# Patient Record
Sex: Female | Born: 1945 | Race: Black or African American | Hispanic: No | Smoking: Former smoker
Health system: Southern US, Community
[De-identification: ages and names within clinical notes are randomized; demographics above are authoritative.]

## PROBLEM LIST (undated history)

## (undated) DIAGNOSIS — F1011 Alcohol abuse, in remission: Secondary | ICD-10-CM

## (undated) DIAGNOSIS — K7460 Unspecified cirrhosis of liver: Secondary | ICD-10-CM

## (undated) DIAGNOSIS — M1990 Unspecified osteoarthritis, unspecified site: Secondary | ICD-10-CM

## (undated) DIAGNOSIS — I10 Essential (primary) hypertension: Secondary | ICD-10-CM

## (undated) DIAGNOSIS — D649 Anemia, unspecified: Secondary | ICD-10-CM

## (undated) DIAGNOSIS — E039 Hypothyroidism, unspecified: Secondary | ICD-10-CM

## (undated) DIAGNOSIS — E785 Hyperlipidemia, unspecified: Secondary | ICD-10-CM

## (undated) DIAGNOSIS — I5030 Unspecified diastolic (congestive) heart failure: Secondary | ICD-10-CM

## (undated) DIAGNOSIS — M109 Gout, unspecified: Secondary | ICD-10-CM

## (undated) DIAGNOSIS — T884XXA Failed or difficult intubation, initial encounter: Secondary | ICD-10-CM

## (undated) DIAGNOSIS — K219 Gastro-esophageal reflux disease without esophagitis: Secondary | ICD-10-CM

## (undated) HISTORY — PX: CATARACT EXTRACTION: SUR2

## (undated) HISTORY — PX: KNEE CARTILAGE SURGERY: SHX688

## (undated) HISTORY — PX: ABDOMINAL HYSTERECTOMY: SHX81

---

## 2016-10-14 ENCOUNTER — Inpatient Hospital Stay: Payer: Medicare Other

## 2016-10-14 ENCOUNTER — Emergency Department: Payer: Medicare Other

## 2016-10-14 ENCOUNTER — Inpatient Hospital Stay
Admission: EM | Admit: 2016-10-14 | Discharge: 2016-10-18 | DRG: 481 | Disposition: A | Discharge status: Skilled Nursing Facility | Payer: Medicare Other | Attending: Internal Medicine | Admitting: Internal Medicine

## 2016-10-14 ENCOUNTER — Encounter: Payer: Self-pay | Admitting: Emergency Medicine

## 2016-10-14 DIAGNOSIS — I5032 Chronic diastolic (congestive) heart failure: Secondary | ICD-10-CM | POA: Diagnosis present

## 2016-10-14 DIAGNOSIS — M1990 Unspecified osteoarthritis, unspecified site: Secondary | ICD-10-CM | POA: Diagnosis present

## 2016-10-14 DIAGNOSIS — W1830XA Fall on same level, unspecified, initial encounter: Secondary | ICD-10-CM | POA: Diagnosis present

## 2016-10-14 DIAGNOSIS — M79652 Pain in left thigh: Secondary | ICD-10-CM | POA: Diagnosis present

## 2016-10-14 DIAGNOSIS — K7460 Unspecified cirrhosis of liver: Secondary | ICD-10-CM | POA: Diagnosis present

## 2016-10-14 DIAGNOSIS — Z79899 Other long term (current) drug therapy: Secondary | ICD-10-CM

## 2016-10-14 DIAGNOSIS — D631 Anemia in chronic kidney disease: Secondary | ICD-10-CM | POA: Diagnosis present

## 2016-10-14 DIAGNOSIS — M109 Gout, unspecified: Secondary | ICD-10-CM | POA: Diagnosis present

## 2016-10-14 DIAGNOSIS — E785 Hyperlipidemia, unspecified: Secondary | ICD-10-CM | POA: Diagnosis present

## 2016-10-14 DIAGNOSIS — Z87891 Personal history of nicotine dependence: Secondary | ICD-10-CM | POA: Diagnosis not present

## 2016-10-14 DIAGNOSIS — D62 Acute posthemorrhagic anemia: Secondary | ICD-10-CM | POA: Diagnosis not present

## 2016-10-14 DIAGNOSIS — E039 Hypothyroidism, unspecified: Secondary | ICD-10-CM | POA: Diagnosis present

## 2016-10-14 DIAGNOSIS — E860 Dehydration: Secondary | ICD-10-CM | POA: Diagnosis present

## 2016-10-14 DIAGNOSIS — S72302A Unspecified fracture of shaft of left femur, initial encounter for closed fracture: Principal | ICD-10-CM | POA: Diagnosis present

## 2016-10-14 DIAGNOSIS — Z419 Encounter for procedure for purposes other than remedying health state, unspecified: Secondary | ICD-10-CM

## 2016-10-14 DIAGNOSIS — I509 Heart failure, unspecified: Secondary | ICD-10-CM

## 2016-10-14 DIAGNOSIS — N179 Acute kidney failure, unspecified: Secondary | ICD-10-CM

## 2016-10-14 DIAGNOSIS — N189 Chronic kidney disease, unspecified: Secondary | ICD-10-CM | POA: Diagnosis present

## 2016-10-14 DIAGNOSIS — K219 Gastro-esophageal reflux disease without esophagitis: Secondary | ICD-10-CM | POA: Diagnosis present

## 2016-10-14 DIAGNOSIS — S72309A Unspecified fracture of shaft of unspecified femur, initial encounter for closed fracture: Secondary | ICD-10-CM

## 2016-10-14 DIAGNOSIS — S72009A Fracture of unspecified part of neck of unspecified femur, initial encounter for closed fracture: Secondary | ICD-10-CM | POA: Diagnosis present

## 2016-10-14 DIAGNOSIS — I130 Hypertensive heart and chronic kidney disease with heart failure and stage 1 through stage 4 chronic kidney disease, or unspecified chronic kidney disease: Secondary | ICD-10-CM | POA: Diagnosis present

## 2016-10-14 DIAGNOSIS — S7292XA Unspecified fracture of left femur, initial encounter for closed fracture: Secondary | ICD-10-CM

## 2016-10-14 HISTORY — DX: Gout, unspecified: M10.9

## 2016-10-14 HISTORY — DX: Hyperlipidemia, unspecified: E78.5

## 2016-10-14 HISTORY — DX: Alcohol abuse, in remission: F10.11

## 2016-10-14 HISTORY — DX: Hypothyroidism, unspecified: E03.9

## 2016-10-14 HISTORY — DX: Unspecified cirrhosis of liver: K74.60

## 2016-10-14 HISTORY — DX: Essential (primary) hypertension: I10

## 2016-10-14 HISTORY — DX: Unspecified diastolic (congestive) heart failure: I50.30

## 2016-10-14 HISTORY — DX: Unspecified osteoarthritis, unspecified site: M19.90

## 2016-10-14 HISTORY — DX: Failed or difficult intubation, initial encounter: T88.4XXA

## 2016-10-14 HISTORY — DX: Gastro-esophageal reflux disease without esophagitis: K21.9

## 2016-10-14 HISTORY — DX: Anemia, unspecified: D64.9

## 2016-10-14 LAB — BASIC METABOLIC PANEL
ANION GAP: 7 (ref 5–15)
BUN: 14 mg/dL (ref 6–20)
CHLORIDE: 99 mmol/L — AB (ref 101–111)
CO2: 29 mmol/L (ref 22–32)
Calcium: 9.5 mg/dL (ref 8.9–10.3)
Creatinine, Ser: 2.1 mg/dL — ABNORMAL HIGH (ref 0.44–1.00)
GFR, EST AFRICAN AMERICAN: 26 mL/min — AB (ref >60)
GFR, EST NON AFRICAN AMERICAN: 22 mL/min — AB (ref >60)
Glucose, Bld: 131 mg/dL — ABNORMAL HIGH (ref 65–99)
POTASSIUM: 4 mmol/L (ref 3.5–5.1)
SODIUM: 135 mmol/L (ref 135–145)

## 2016-10-14 LAB — CBC
HCT: 39.7 % (ref 35.0–47.0)
HEMOGLOBIN: 13 g/dL (ref 12.0–16.0)
MCH: 28.9 pg (ref 26.0–34.0)
MCHC: 32.9 g/dL (ref 32.0–36.0)
MCV: 88 fL (ref 80.0–100.0)
PLATELETS: 221 10*3/uL (ref 150–440)
RBC: 4.51 MIL/uL (ref 3.80–5.20)
RDW: 16.1 % — ABNORMAL HIGH (ref 11.5–14.5)
WBC: 8.5 10*3/uL (ref 3.6–11.0)

## 2016-10-14 LAB — URINALYSIS, COMPLETE (UACMP) WITH MICROSCOPIC
BILIRUBIN URINE: NEGATIVE
Bacteria, UA: NONE SEEN
Glucose, UA: NEGATIVE mg/dL
Ketones, ur: NEGATIVE mg/dL
LEUKOCYTES UA: NEGATIVE
Nitrite: NEGATIVE
PH: 6 (ref 5.0–8.0)
Protein, ur: 30 mg/dL — AB
Specific Gravity, Urine: 1.01 (ref 1.005–1.030)

## 2016-10-14 LAB — APTT: APTT: 32 s (ref 24–36)

## 2016-10-14 LAB — TYPE AND SCREEN
ABO/RH(D): B POS
Antibody Screen: NEGATIVE

## 2016-10-14 LAB — PROTIME-INR
INR: 1.13
PROTHROMBIN TIME: 14.6 s (ref 11.4–15.2)

## 2016-10-14 LAB — SURGICAL PCR SCREEN
MRSA, PCR: NEGATIVE
Staphylococcus aureus: NEGATIVE

## 2016-10-14 LAB — CK: CK TOTAL: 95 U/L (ref 38–234)

## 2016-10-14 MED ORDER — ACETAMINOPHEN 650 MG RE SUPP: 650.0000 mg | Freq: Four times a day (QID) | RECTAL | Status: DC | PRN

## 2016-10-14 MED ORDER — MORPHINE SULFATE (PF) 4 MG/ML IV SOLN
4.0000 mg | Freq: Once | INTRAVENOUS | Status: DC
  Filled 2016-10-14: qty 1

## 2016-10-14 MED ORDER — VITAMIN B-1 100 MG PO TABS
100.0000 mg | ORAL_TABLET | Freq: Every day | ORAL | Status: DC
  Administered 2016-10-14 – 2016-10-18 (×4): 100 mg via ORAL
  Filled 2016-10-14 (×4): qty 1

## 2016-10-14 MED ORDER — FOLIC ACID 1 MG PO TABS
1.0000 mg | ORAL_TABLET | Freq: Every day | ORAL | Status: DC
  Administered 2016-10-14 – 2016-10-18 (×4): 1 mg via ORAL
  Filled 2016-10-14 (×4): qty 1

## 2016-10-14 MED ORDER — HYDROXYZINE PAMOATE 25 MG PO CAPS
25.0000 mg | ORAL_CAPSULE | Freq: Two times a day (BID) | ORAL | Status: DC
  Filled 2016-10-14 (×2): qty 1

## 2016-10-14 MED ORDER — LOSARTAN POTASSIUM 25 MG PO TABS
12.5000 mg | ORAL_TABLET | Freq: Every day | ORAL | Status: DC
  Administered 2016-10-14: 12.5 mg via ORAL
  Filled 2016-10-14: qty 0.5

## 2016-10-14 MED ORDER — OXYCODONE HCL 5 MG PO TABS
5.0000 mg | ORAL_TABLET | ORAL | Status: DC | PRN
  Administered 2016-10-14 – 2016-10-15 (×4): 5 mg via ORAL
  Filled 2016-10-14 (×4): qty 1

## 2016-10-14 MED ORDER — TRAZODONE HCL 100 MG PO TABS
100.0000 mg | ORAL_TABLET | Freq: Every day | ORAL | Status: DC
  Administered 2016-10-14 – 2016-10-17 (×4): 100 mg via ORAL
  Filled 2016-10-14 (×4): qty 1

## 2016-10-14 MED ORDER — ONDANSETRON HCL 4 MG/2ML IJ SOLN: 4.0000 mg | Freq: Four times a day (QID) | INTRAMUSCULAR | Status: DC | PRN

## 2016-10-14 MED ORDER — MORPHINE SULFATE (PF) 2 MG/ML IV SOLN
2.0000 mg | Freq: Once | INTRAVENOUS | Status: AC
  Administered 2016-10-14: 2 mg via INTRAVENOUS
  Filled 2016-10-14: qty 1

## 2016-10-14 MED ORDER — LEVOTHYROXINE SODIUM 100 MCG PO TABS
100.0000 ug | ORAL_TABLET | Freq: Every day | ORAL | Status: DC
  Administered 2016-10-16 – 2016-10-18 (×3): 100 ug via ORAL
  Filled 2016-10-14 (×3): qty 1

## 2016-10-14 MED ORDER — GABAPENTIN 300 MG PO CAPS
900.0000 mg | ORAL_CAPSULE | Freq: Three times a day (TID) | ORAL | Status: DC
  Administered 2016-10-14 – 2016-10-18 (×10): 900 mg via ORAL
  Filled 2016-10-14 (×10): qty 3

## 2016-10-14 MED ORDER — ROSUVASTATIN CALCIUM 20 MG PO TABS
40.0000 mg | ORAL_TABLET | Freq: Every day | ORAL | Status: DC
  Administered 2016-10-14 – 2016-10-18 (×4): 40 mg via ORAL
  Filled 2016-10-14 (×4): qty 2
  Filled 2016-10-14: qty 4

## 2016-10-14 MED ORDER — ONDANSETRON HCL 4 MG PO TABS: 4.0000 mg | ORAL_TABLET | Freq: Four times a day (QID) | ORAL | Status: DC | PRN

## 2016-10-14 MED ORDER — CARVEDILOL 6.25 MG PO TABS
6.2500 mg | ORAL_TABLET | Freq: Two times a day (BID) | ORAL | Status: DC
  Administered 2016-10-14 – 2016-10-18 (×7): 6.25 mg via ORAL
  Filled 2016-10-14 (×7): qty 1

## 2016-10-14 MED ORDER — HYDROXYZINE HCL 25 MG PO TABS
25.0000 mg | ORAL_TABLET | Freq: Two times a day (BID) | ORAL | Status: DC
  Administered 2016-10-14 – 2016-10-18 (×8): 25 mg via ORAL
  Filled 2016-10-14 (×9): qty 1

## 2016-10-14 MED ORDER — PANTOPRAZOLE SODIUM 40 MG PO TBEC
40.0000 mg | DELAYED_RELEASE_TABLET | Freq: Every day | ORAL | Status: DC
  Administered 2016-10-14 – 2016-10-18 (×4): 40 mg via ORAL
  Filled 2016-10-14 (×4): qty 1

## 2016-10-14 MED ORDER — HYDRALAZINE HCL 20 MG/ML IJ SOLN
10.0000 mg | Freq: Four times a day (QID) | INTRAMUSCULAR | Status: DC | PRN
  Administered 2016-10-14: 10 mg via INTRAVENOUS
  Filled 2016-10-14: qty 1

## 2016-10-14 MED ORDER — CEFAZOLIN SODIUM-DEXTROSE 2-4 GM/100ML-% IV SOLN
2.0000 g | INTRAVENOUS | Status: AC
  Administered 2016-10-15: 2 g via INTRAVENOUS
  Filled 2016-10-14: qty 100

## 2016-10-14 MED ORDER — ALLOPURINOL 100 MG PO TABS
100.0000 mg | ORAL_TABLET | Freq: Every day | ORAL | Status: DC
  Administered 2016-10-14 – 2016-10-18 (×4): 100 mg via ORAL
  Filled 2016-10-14 (×4): qty 1

## 2016-10-14 MED ORDER — SODIUM CHLORIDE 0.9 % IV BOLUS (SEPSIS)
1000.0000 mL | Freq: Once | INTRAVENOUS | Status: AC
  Administered 2016-10-14: 1000 mL via INTRAVENOUS

## 2016-10-14 MED ORDER — SODIUM CHLORIDE 0.9 % IV SOLN
INTRAVENOUS | Status: DC
  Administered 2016-10-14 – 2016-10-15 (×4): via INTRAVENOUS

## 2016-10-14 MED ORDER — ACETAMINOPHEN 325 MG PO TABS
650.0000 mg | ORAL_TABLET | Freq: Four times a day (QID) | ORAL | Status: DC | PRN
  Administered 2016-10-17: 650 mg via ORAL
  Filled 2016-10-14 (×2): qty 2

## 2016-10-14 NOTE — Consult Note (Signed)
ORTHOPAEDIC CONSULTATION  PATIENT NAME: Sarah Lowery DOB: 09/25/1945  MRN: [id number]  REQUESTING PHYSICIAN: Auburn BilberryPatel, Shreyang, MD  Chief Complaint: Left thigh pain  HPI: Sarah EonRosalind Kucharski is a 71 y.o. female who complains of  left thigh pain. She reports this 7-10 month history of progressive bilateral knee pain. She apparently awoke to the bathroom last night and states that her knees buckled and she eventually fell to the floor, causing the left leg to be flexed underneath her. She was unable to stand or bear weight due to the left thigh pain. She remained on the floor throughout the night to go she was found by her family this morning. She denied any loss of consciousness. She denied any other injury. Prior to this injury she was using a walker for ambulation due to the knee pain.  Past Medical History:  Diagnosis Date  . Anemia   . Arthritis   . Diastolic CHF (HCC)   . GERD (gastroesophageal reflux disease)   . Gout   . H/O ETOH abuse   . Hyperlipemia   . Hypertension   . Hypothyroidism   . Liver cirrhosis (HCC)   . OA (osteoarthritis)    Past Surgical History:  Procedure Laterality Date  . ABDOMINAL HYSTERECTOMY    . CATARACT EXTRACTION    . CESAREAN SECTION    . KNEE CARTILAGE SURGERY Bilateral    Social History   Social History  . Marital status: Divorced    Spouse name: N/A  . Number of children: N/A  . Years of education: N/A   Social History Main Topics  . Smoking status: Former Games developermoker  . Smokeless tobacco: Never Used  . Alcohol use No     Comment: h/o  . Drug use: No  . Sexual activity: Not Asked   Other Topics Concern  . None   Social History Narrative  . None   Family History  Problem Relation Age of Onset  . Cancer Mother    Allergies  Allergen Reactions  . Hydrochlorothiazide Other (See Comments)    Causes hyponatremia if used concomitantly with furosemide  . Ace Inhibitors Other (See Comments)    cough  . Isosorbide Nitrate      headache  . Nsaids     Kidney Disease   Prior to Admission medications   Medication Sig Start Date End Date Taking? Authorizing Provider  allopurinol (ZYLOPRIM) 100 MG tablet Take 100 mg by mouth daily.   Yes [provider]  carvedilol (COREG) 6.25 MG tablet Take 6.25 mg by mouth 2 (two) times daily with a meal.   Yes [provider]  folic acid (FOLVITE) 1 MG tablet Take 1 mg by mouth daily.   Yes [provider]  furosemide (LASIX) 40 MG tablet Take 80 mg by mouth.   Yes [provider]  gabapentin (NEURONTIN) 300 MG capsule Take 900 mg by mouth 3 (three) times daily.   Yes [provider]  hydrOXYzine (VISTARIL) 25 MG capsule Take 25 mg by mouth 2 (two) times daily.   Yes [provider]  levothyroxine (SYNTHROID, LEVOTHROID) 100 MCG tablet Take 100 mcg by mouth daily before breakfast.   Yes [provider]  losartan (COZAAR) 25 MG tablet Take 12.5 mg by mouth daily.   Yes [provider]  omeprazole (PRILOSEC) 20 MG capsule Take 20 mg by mouth daily.   Yes [provider]  rosuvastatin (CRESTOR) 40 MG tablet Take 40 mg by mouth daily.   Yes [provider]  thiamine (VITAMIN B-1) 100 MG tablet Take 100 mg by mouth daily.   Yes [provider]  traZODone (DESYREL) 50 MG tablet Take 100 mg by mouth at bedtime.   Yes [provider]  oxycodone (OXY-IR) 5 MG capsule Take 5 mg by mouth every 8 (eight) hours as needed.    [provider]   Dg Chest Port 1 View  Result Date: 10/14/2016 CLINICAL DATA:  Status post fall at home today. History of CHF, hypertension, cirrhosis, former smoker. EXAM: PORTABLE CHEST 1 VIEW COMPARISON:  None in PACs FINDINGS: The patient is rotated to the right on this study. There is mild elevation of the left hemidiaphragm Wood's which limits excursion of the left lung. The aerated portions of both lungs are clear. The heart is top-normal in size. The  pulmonary vascularity is not engorged. The bony thorax exhibits no acute abnormality. IMPRESSION: No pneumonia nor pulmonary edema. Mild elevation of the left hemidiaphragm of uncertain age. Electronically Signed   By: David  [place] M.D.   On: 10/14/2016 10:55   Dg Knee Left Port  Result Date: 10/14/2016 CLINICAL DATA:  Left knee pain after fall today at home. EXAM: PORTABLE LEFT KNEE - 1-2 VIEW COMPARISON:  None. FINDINGS: Severely displaced spiral fracture of distal left femur is noted. Severe degenerative joint disease is noted medially in the left knee. No soft tissue abnormality is noted. IMPRESSION: Severely displaced spiral fracture of distal left femur shaft. Electronically Signed   By: Lupita Raider, M.D.   On: 10/14/2016 09:25   Dg Femur Portable Min 2 Views Left  Result Date: 10/14/2016 CLINICAL DATA:  Left femur pain after fall today at home. EXAM: LEFT FEMUR PORTABLE 2 VIEWS COMPARISON:  None. FINDINGS: Severely displaced spiral fracture is seen involving the distal left femoral shaft. Severe degenerative changes seen involving the left knee joint medially. Left hip is unremarkable. IMPRESSION: Severely displaced spiral fracture involving the distal left femoral shaft. Electronically Signed   By: Lupita Raider, M.D.   On: 10/14/2016 09:24    Positive ROS: All other systems have been reviewed and were otherwise negative with the exception of those mentioned in the HPI and as above.  Physical Exam: General: Well developed, well nourished female seen in no acute distress. HEENT: Atraumatic and normocephalic. Sclera are clear. Extraocular motion is intact. Oropharynx is clear with moist mucosa. Neck: Supple, nontender, good range of motion. No JVD or carotid bruits. Lungs: Clear to auscultation bilaterally. Cardiovascular: Regular rate and rhythm with normal S1 and S2. No murmurs. No gallops or rubs. Pedal pulses are palpable bilaterally. Homans test is negative bilaterally. No  significant pretibial or ankle edema. Abdomen: Soft, nontender, and nondistended. Bowel sounds are present. Skin: No lesions in the area of chief complaint Neurologic: Awake, alert, and oriented. Sensory function is grossly intact. Motor strength is felt to be 5 over 5 with the exception of the left lower extremity that was not assessed due to the injury. No clonus or tremor. Good motor coordination. Lymphatic: No axillary or cervical lymphadenopathy  MUSCULOSKELETAL: Examination of the left lower extremity demonstrates gross instability to the distal third of the thigh. Moderate soft tissue swelling is noted about the knee and thigh. The skin is intact. Pain is elicited with any attempted range of motion. No gross tenderness to palpation about the foot or ankle. No significant tenderness to palpation about the greater trochanter.  Radiographs: I reviewed AP and lateral radiographs of the left femur  and left knee. Severe degenerative changes are noted to the left knee. There is an oblique fracture at the juncture of the middle and distal third of the femur with displacement.  Assessment: Left distal femur fracture  Plan: The findings were discussed in detail with the patient. Recommendation was made for open reduction and internal fixation of the left distal femur fracture. The usual perioperative course was discussed. The risks and benefits of surgical intervention were reviewed. The patient expressed understanding of the risks and benefits and agreed with plans for surgical intervention.   The surgical site was signed as per the "right site surgery" protocol.   James P. Angie Fava M.D.

## 2016-10-14 NOTE — ED Triage Notes (Signed)
Pt to ED from home via EMS after fall this morning around 1am.  States knee gave out and patient assisted self to floor.  Denies hitting head or LOC.  Patient with swollen painful left knee.

## 2016-10-14 NOTE — ED Notes (Signed)
Admitting MD at bedside.

## 2016-10-14 NOTE — Progress Notes (Signed)
ADMISSION NOTE:  Pt admitted to room 143 via stretcher from the ER. Pts BP elevated, per ER report IV Hydralazine was given. MRSA Surgical PCR sent, TEDS, SCD's, polar care and knee immobilizer applied. Skin assessment done with Raynelle FanningJulie, RN. Foley catheter inserted. Bed in lowest position, call bed within reach and bed alarm on.

## 2016-10-14 NOTE — H&P (Signed)
[facility] [place] [facility]   PATIENT NAME: Sarah Lowery    MR#:  [id number]  DATE OF BIRTH:  11/22/1945  DATE OF ADMISSION:  10/14/2016  PRIMARY CARE PHYSICIAN: Orland Mustardietz, Ashley M, MD   REQUESTING/REFERRING PHYSICIAN: Sharyn CreamerQuale Mark MD  CHIEF COMPLAINT:   Chief Complaint  Patient presents with  . Fall  . Knee Pain    HISTORY OF PRESENT ILLNESS: Sarah Lowery  is a 71 y.o. female with a known history of  Osteoarthritis, history of alcohol abuse in the past, history of liver cirrhosis., Diastolic CHF, gout, hyperlipidemia, essential hypertension and hypothyroidism who states that she's been having trouble with both her knees for the past few months and and has been wearing a knee brace intermittently. Who woke up around 1 AM as well as go to the bathroom. When she got to the sink her knees buckled and she felt like she was going to fall so she held onto the sink as long that she caught and then subsequently fell. Patient is noted to have a left femoral fracture. The emergency room physician has ordered a contacted the orthopedic physician on call. Patient reports that she does occasionally get shortness of breath but not on daily basis. Her activity level is limited. She does not complaining of any chest pain or palpitations denies any swelling of her lower extremity. Denies any nausea vomiting or diarrhea.      PAST MEDICAL HISTORY:   Past Medical History:  Diagnosis Date  . Arthritis   . Diastolic CHF (HCC)   . Gout   . H/O ETOH abuse   . Hyperlipemia   . Hypertension   . OA (osteoarthritis)   . Thyroid disease     PAST SURGICAL HISTORY:  Past Surgical History:  Procedure Laterality Date  . ABDOMINAL HYSTERECTOMY    . CATARACT EXTRACTION    . CESAREAN SECTION    . KNEE CARTILAGE SURGERY Bilateral     SOCIAL HISTORY:  Social History  Substance Use Topics  . Smoking status: Former Games developermoker  . Smokeless tobacco: Never Used  . Alcohol use No      Comment: h/o    FAMILY HISTORY:  Family History  Problem Relation Age of Onset  . Cancer Mother     DRUG ALLERGIES:  Allergies  Allergen Reactions  . Hydrochlorothiazide Other (See Comments)    Causes hyponatremia if used concomitantly with furosemide  . Ace Inhibitors Other (See Comments)    cough  . Isosorbide Nitrate     headache  . Nsaids     Kidney Disease    REVIEW OF SYSTEMS:   CONSTITUTIONAL: No fever, fatigue or weakness.  EYES: No blurred or double vision.  EARS, NOSE, AND THROAT: No tinnitus or ear pain.  RESPIRATORY: No cough, shortness of breath, wheezing or hemoptysis.  CARDIOVASCULAR: No chest pain, orthopnea, edema.  GASTROINTESTINAL: No nausea, vomiting, diarrhea or abdominal pain.  GENITOURINARY: No dysuria, hematuria.  ENDOCRINE: No polyuria, nocturia,  HEMATOLOGY: No anemia, easy bruising or bleeding SKIN: No rash or lesion. MUSCULOSKELETAL: Positive joint pain or positive positive arthritis.   NEUROLOGIC: No tingling, numbness, weakness.  PSYCHIATRY: No anxiety or depression.   MEDICATIONS AT HOME:  Prior to Admission medications   Medication Sig Start Date End Date Taking? Authorizing Provider  allopurinol (ZYLOPRIM) 100 MG tablet Take 100 mg by mouth daily.   Yes [provider]  carvedilol (COREG) 6.25 MG tablet Take 6.25 mg by mouth 2 (two) times daily  with a meal.   Yes [provider]  folic acid (FOLVITE) 1 MG tablet Take 1 mg by mouth daily.   Yes [provider]  furosemide (LASIX) 40 MG tablet Take 80 mg by mouth.   Yes [provider]  gabapentin (NEURONTIN) 300 MG capsule Take 900 mg by mouth 3 (three) times daily.   Yes [provider]  hydrOXYzine (VISTARIL) 25 MG capsule Take 25 mg by mouth 2 (two) times daily.   Yes [provider]  levothyroxine (SYNTHROID, LEVOTHROID) 100 MCG tablet Take 100 mcg by mouth daily before breakfast.   Yes [provider]  losartan (COZAAR)  25 MG tablet Take 12.5 mg by mouth daily.   Yes [provider]  omeprazole (PRILOSEC) 20 MG capsule Take 20 mg by mouth daily.   Yes [provider]  rosuvastatin (CRESTOR) 40 MG tablet Take 40 mg by mouth daily.   Yes [provider]  thiamine (VITAMIN B-1) 100 MG tablet Take 100 mg by mouth daily.   Yes [provider]  traZODone (DESYREL) 50 MG tablet Take 100 mg by mouth at bedtime.   Yes [provider]  oxycodone (OXY-IR) 5 MG capsule Take 5 mg by mouth every 8 (eight) hours as needed.    [provider]      PHYSICAL EXAMINATION:   VITAL SIGNS: Blood pressure (!) 184/74, pulse 82, temperature 98.5 F (36.9 C), temperature source Oral, resp. rate 16, height 5\' 6"  (1.676 m), weight 140 lb (63.5 kg), SpO2 94 %.  GENERAL:  71 y.o.-year-old patient lying in the bed with no acute distress.  EYES: Pupils equal, round, reactive to light and accommodation. No scleral icterus. Extraocular muscles intact.  HEENT: Head atraumatic, normocephalic. Oropharynx and nasopharynx clear.  NECK:  Supple, no jugular venous distention. No thyroid enlargement, no tenderness.  LUNGS: Normal breath sounds bilaterally, no wheezing, rales,rhonchi or crepitation. No use of accessory muscles of respiration.  CARDIOVASCULAR: S1, S2 normal. No murmurs, rubs, or gallops.  ABDOMEN: Soft, nontender, nondistended. Bowel sounds present. No organomegaly or mass.  EXTREMITIES: No pedal edema, cyanosis, or clubbing.  NEUROLOGIC: Cranial nerves II through XII are intact. Muscle strength 5/5 in all extremities. Sensation intact. Gait not checked.  PSYCHIATRIC: The patient is alert and oriented x 3.  SKIN: No obvious rash, lesion, or ulcer.   LABORATORY PANEL:   CBC  Recent Labs Lab 10/14/16 0825  WBC 8.5  HGB 13.0  HCT 39.7  PLT 221  MCV 88.0  MCH 28.9  MCHC 32.9  RDW 16.1*    ------------------------------------------------------------------------------------------------------------------  Chemistries   Recent Labs Lab 10/14/16 0825  NA 135  K 4.0  CL 99*  CO2 29  GLUCOSE 131*  BUN 14  CREATININE 2.10*  CALCIUM 9.5   ------------------------------------------------------------------------------------------------------------------ estimated creatinine clearance is 22.7 mL/min (A) (by C-G formula based on SCr of 2.1 mg/dL (H)). ------------------------------------------------------------------------------------------------------------------ No results for input(s): TSH, T4TOTAL, T3FREE, THYROIDAB in the last 72 hours.  Invalid input(s): FREET3   Coagulation profile No results for input(s): INR, PROTIME in the last 168 hours. ------------------------------------------------------------------------------------------------------------------- No results for input(s): DDIMER in the last 72 hours. -------------------------------------------------------------------------------------------------------------------  Cardiac Enzymes No results for input(s): CKMB, TROPONINI, MYOGLOBIN in the last 168 hours.  Invalid input(s): CK ------------------------------------------------------------------------------------------------------------------ Invalid input(s): POCBNP  ---------------------------------------------------------------------------------------------------------------  Urinalysis No results found for: COLORURINE, APPEARANCEUR, LABSPEC, PHURINE, GLUCOSEU, HGBUR, BILIRUBINUR, KETONESUR, PROTEINUR, UROBILINOGEN, NITRITE, LEUKOCYTESUR   RADIOLOGY: Dg Knee Left Port  Result Date: 10/14/2016 CLINICAL DATA:  Left  knee pain after fall today at home. EXAM: PORTABLE LEFT KNEE - 1-2 VIEW COMPARISON:  None. FINDINGS: Severely displaced spiral fracture of distal left femur is noted. Severe degenerative joint disease is noted medially in the left knee. No  soft tissue abnormality is noted. IMPRESSION: Severely displaced spiral fracture of distal left femur shaft. Electronically Signed   By: Lupita Raider, M.D.   On: 10/14/2016 09:25   Dg Femur Portable Min 2 Views Left  Result Date: 10/14/2016 CLINICAL DATA:  Left femur pain after fall today at home. EXAM: LEFT FEMUR PORTABLE 2 VIEWS COMPARISON:  None. FINDINGS: Severely displaced spiral fracture is seen involving the distal left femoral shaft. Severe degenerative changes seen involving the left knee joint medially. Left hip is unremarkable. IMPRESSION: Severely displaced spiral fracture involving the distal left femoral shaft. Electronically Signed   By: Lupita Raider, M.D.   On: 10/14/2016 09:24    EKG: No orders found for this or any previous visit.  IMPRESSION AND PLAN: Patient is a 71 year old African-American female presenting with a fall noted to have left-sided hip fracture  1. Left-sided hip fracture Patient at moderate risk of surgical complications. I do recommend IV hydration for today due to renal failure. Aspirin or throw the plan is for surgery tomorrow. No further cardiopulmonary workup needed. Okay to proceed to surgery  2. Acute renal failure suspect due to dehydration and diuresis We will provide her with IV hydration. Stop her Lasix Follow renal function if no significant improvement by tomorrow nephrology consult  3. Essential hypertension we will continue her Coreg and losartan Blood pressure currently elevated due to pain will use IV when necessary hydralazine  4. Hypothyroidism continue Synthroid  5. Hyperlipidemia unspecified continue Crestor  6. Miscellaneous recommend Lovenox postop       All the records are reviewed and case discussed with ED provider. Management plans discussed with the patient, family and they are in agreement.  CODE STATUS: Code Status History    This patient does not have a recorded code status. Please follow your  organizational policy for patients in this situation.       TOTAL TIME TAKING CARE OF THIS PATIENT 55 minutes.    Auburn Bilberry M.D on 10/14/2016 at 10:40 AM  Between 7am to 6pm - Pager - [phone]  After 6pm go to [email or web address] - password [email or web address]  [facility] [place] [facility]  Office  [phone]  CC: Primary care physician; Orland Mustard, MD

## 2016-10-14 NOTE — ED Notes (Signed)
X-ray at bedside

## 2016-10-14 NOTE — Plan of Care (Signed)
Problem: Safety: Goal: Ability to remain free from injury will improve Outcome: Progressing Bed in lowest position call bell within reach and bed alarm on    

## 2016-10-14 NOTE — ED Provider Notes (Signed)
[facility] Emergency Department Provider Note ____________________________________________   First MD Initiated Contact with Patient 10/14/16 [phone]     (approximate)  I have reviewed the triage vital signs and the nursing notes.   HISTORY  Chief Complaint Fall and Knee Pain  HPI Sarah Lowery is a 71 y.o. female who has a history of elevated blood pressure, thyroid disease, and chronic bilateral knee pain.  Patient reports that about once every few months she has to have fluid drained off both knees due to severe arthritis. She is noticed that her knees have slowly increased in size of the last several weeks. Early this morning she went to the bathroom,and while she was using the bathroom she reports that her knees felt weak, she had to lower herself to the ground, but reports that as she was lowering herself her left knee felt like it "buckled". She is had ongoing pain causing her to not be able to walk in her left knee. Denies any numbness or weakness in the leg. Denies any other injury. She lowered herself to the ground and did not land striking her head neck or back. She needed assistance to get up to a chair, or EMS found her this morning with family.  She reports that pain is moderate when sitting still located in the left knee, but when she moves at all she has severe sharp pain in the left knee. The knee is swollen, but she reports no more so than normal and both knees are chronically swollen.     Past Medical History:  Diagnosis Date  . Arthritis   . Hypertension   . Thyroid disease     There are no active problems to display for this patient.   Past Surgical History:  Procedure Laterality Date  . ABDOMINAL HYSTERECTOMY    . CESAREAN SECTION    . KNEE CARTILAGE SURGERY Bilateral     Prior to Admission medications   Medication Sig Start Date End Date Taking? Authorizing Provider  allopurinol (ZYLOPRIM) 100 MG tablet Take 100 mg by mouth  daily.   Yes [provider]  carvedilol (COREG) 6.25 MG tablet Take 6.25 mg by mouth 2 (two) times daily with a meal.   Yes [provider]  folic acid (FOLVITE) 1 MG tablet Take 1 mg by mouth daily.   Yes [provider]  furosemide (LASIX) 40 MG tablet Take 80 mg by mouth.   Yes [provider]  gabapentin (NEURONTIN) 300 MG capsule Take 900 mg by mouth 3 (three) times daily.   Yes [provider]  hydrOXYzine (VISTARIL) 25 MG capsule Take 25 mg by mouth 2 (two) times daily.   Yes [provider]  levothyroxine (SYNTHROID, LEVOTHROID) 100 MCG tablet Take 100 mcg by mouth daily before breakfast.   Yes [provider]  losartan (COZAAR) 25 MG tablet Take 12.5 mg by mouth daily.   Yes [provider]  omeprazole (PRILOSEC) 20 MG capsule Take 20 mg by mouth daily.   Yes [provider]  rosuvastatin (CRESTOR) 40 MG tablet Take 40 mg by mouth daily.   Yes [provider]  thiamine (VITAMIN B-1) 100 MG tablet Take 100 mg by mouth daily.   Yes [provider]  traZODone (DESYREL) 50 MG tablet Take 100 mg by mouth at bedtime.   Yes [provider]  oxycodone (OXY-IR) 5 MG capsule Take 5 mg by mouth every 8 (eight) hours as needed.    [provider]    Allergies Patient has no known allergies.  History reviewed. No pertinent family history.  Social History Social History  Substance Use Topics  . Smoking status: Former Games developer  . Smokeless tobacco: Never Used  . Alcohol use No    Review of Systems Constitutional: No fever/chills. No nausea or fatigue.  Eyes: No visual changes. Cardiovascular: Denies chest pain. Respiratory: Denies shortness of breath. Gastrointestinal: No abdominal pain.  No nausea, no vomiting.   Genitourinary: Negative for dysuria. Musculoskeletal: Negative for back pain. Skin: Negative for rash. Denies that the knee has felt warm or red.  Neurological:  Negative for headaches, focal weakness or numbness.  10-point ROS otherwise negative.  ____________________________________________   PHYSICAL EXAM:  VITAL SIGNS: ED Triage Vitals  Enc Vitals Group     BP 10/14/16 0805 (!) 166/68     Pulse Rate 10/14/16 0805 74     Resp 10/14/16 0805 16     Temp 10/14/16 0805 98.5 F (36.9 C)     Temp Source 10/14/16 0805 Oral     SpO2 10/14/16 0805 94 %     Weight 10/14/16 0808 140 lb (63.5 kg)     Height 10/14/16 0808 5\' 6"  (1.676 m)     Head Circumference --      Peak Flow --      Pain Score 10/14/16 0805 10     Pain Loc --      Pain Edu? --      Excl. in GC? --     Constitutional: Alert and oriented. Well appearing and in no acute distress. Eyes: Conjunctivae are normal. PERRL. EOMI. Head: Atraumatic. Nose: No congestion/rhinnorhea. Mouth/Throat: Mucous membranes are moist.  Oropharynx non-erythematous. Neck: No stridor.   Cardiovascular: Normal rate, regular rhythm. Grossly normal heart sounds.  Good peripheral circulation. Respiratory: Normal respiratory effort.  No retractions. Lungs CTAB. Gastrointestinal: Soft and nontender. No distention.  Musculoskeletal:  Lower Extremities  No edema. Normal DP/PT pulses bilateral with good cap refill.  Normal neuro-motor function lower extremities bilateral.  RIGHT Right lower extremity demonstrates normal strength, good use of all muscles. No edema bruising or contusions of the right hip, right knee, right ankle. Full range of motion of the right lower extremity though the patient does report some aching pain in her right knee, but she reports this is normal for her. No pain on axial loading. No evidence of trauma. The right knee demonstrates a moderate effusion, no focal tenderness, no warmth or overlying erythema.  LEFT Left lower extremity demonstrates normal strength, good use of all muscles. No edema bruising or contusions of the hip,  ankle. Full range of motion of the left lower  extremity but with notable pain in the left knee joint with flexion and extension at the knee in particular. No pain on axial loading. The patient has a moderate left knee joint effusion, no warmth or overlying erythema. Compartments in the upper and lower leg appear soft. The patient reports that both of her knees are swollen regularly, does happen in the past, but she does report significant pain to palpation especially along the distal portion of the left femur. There is no obvious deformity. No crepitance.  Intact dorsalis pedis pulses in the lower extremities with normal capillary refill bilateral.  Neurologic:  Normal speech and language. No gross focal neurologic deficits are appreciated. normal dorsi and plantar flexion both feet bilaterally  Skin:  Skin is warm, dry and intact. No rash noted. Psychiatric: Mood and  affect are normal. Speech and behavior are normal.  ____________________________________________   LABS (all labs ordered are listed, but only abnormal results are displayed)  Labs Reviewed  CBC - Abnormal; Notable for the following:       Result Value   RDW 16.1 (*)    All other components within normal limits  BASIC METABOLIC PANEL - Abnormal; Notable for the following:    Chloride 99 (*)    Glucose, Bld 131 (*)    Creatinine, Ser 2.10 (*)    GFR calc non Af Amer 22 (*)    GFR calc Af Amer 26 (*)    All other components within normal limits  CK  URINALYSIS, COMPLETE (UACMP) WITH MICROSCOPIC   ____________________________________________  EKG   ____________________________________________  RADIOLOGY  Dg Knee Left Port  Result Date: 10/14/2016 CLINICAL DATA:  Left knee pain after fall today at home. EXAM: PORTABLE LEFT KNEE - 1-2 VIEW COMPARISON:  None. FINDINGS: Severely displaced spiral fracture of distal left femur is noted. Severe degenerative joint disease is noted medially in the left knee. No soft tissue abnormality is noted. IMPRESSION: Severely  displaced spiral fracture of distal left femur shaft. Electronically Signed   By: Lupita Raider, M.D.   On: 10/14/2016 09:25   Dg Femur Portable Min 2 Views Left  Result Date: 10/14/2016 CLINICAL DATA:  Left femur pain after fall today at home. EXAM: LEFT FEMUR PORTABLE 2 VIEWS COMPARISON:  None. FINDINGS: Severely displaced spiral fracture is seen involving the distal left femoral shaft. Severe degenerative changes seen involving the left knee joint medially. Left hip is unremarkable. IMPRESSION: Severely displaced spiral fracture involving the distal left femoral shaft. Electronically Signed   By: Lupita Raider, M.D.   On: 10/14/2016 09:24    ____________________________________________   PROCEDURES  Procedure(s) performed: None  Procedures  Critical Care performed: No  ____________________________________________   INITIAL IMPRESSION / ASSESSMENT AND PLAN / ED COURSE  Pertinent labs & imaging results that were available during my care of the patient were reviewed by me and considered in my medical decision making (see chart for details).  Patient lives for evaluation of left lower thigh/knee pain. Reports a sudden and severe pain while standing, after having the lower herself to the ground. She is distally neurovascularly intact.  ----------------------------------------- 9:33 AM on 10/14/2016 -----------------------------------------  X-rays reveal obvious injury to the distal left femur. Reevaluated the patient, she remains with soft compartments but reports pain is starting to come back, I have ordered additional morphine. She is awake and alert. In addition, her creatinine has elevated and her GFR is been reduced significantly from previous where it was near normal at [facility]. Appears also acute kidney injury, have added on CK, UA.  Anticipate admission to the hospital with consultation and paged out to orthopedics at this time.       ____________________________________________   FINAL CLINICAL IMPRESSION(S) / ED DIAGNOSES  Final diagnoses:  Closed fracture of left femur, unspecified fracture morphology, unspecified portion of femur, initial encounter (HCC)  Acute kidney injury (HCC)      NEW MEDICATIONS STARTED DURING THIS VISIT:  New Prescriptions   No medications on file     Note:  This document was prepared using Dragon voice recognition software and may include unintentional dictation errors.     Sharyn Creamer, MD 10/14/16 [phone]

## 2016-10-15 ENCOUNTER — Inpatient Hospital Stay: Payer: Medicare Other | Admitting: Anesthesiology

## 2016-10-15 ENCOUNTER — Inpatient Hospital Stay: Payer: Medicare Other

## 2016-10-15 ENCOUNTER — Encounter: Payer: Self-pay | Admitting: Anesthesiology

## 2016-10-15 ENCOUNTER — Encounter
Admission: EM | Disposition: A | Discharge status: Skilled Nursing Facility | Payer: Self-pay | Source: Home / Self Care | Attending: Internal Medicine

## 2016-10-15 HISTORY — PX: FEMUR IM NAIL: SHX1597

## 2016-10-15 LAB — BASIC METABOLIC PANEL
Anion gap: 7 (ref 5–15)
BUN: 11 mg/dL (ref 6–20)
CO2: 21 mmol/L — AB (ref 22–32)
CREATININE: 1.55 mg/dL — AB (ref 0.44–1.00)
Calcium: 8.6 mg/dL — ABNORMAL LOW (ref 8.9–10.3)
Chloride: 111 mmol/L (ref 101–111)
GFR calc non Af Amer: 32 mL/min — ABNORMAL LOW (ref >60)
GFR, EST AFRICAN AMERICAN: 37 mL/min — AB (ref >60)
Glucose, Bld: 98 mg/dL (ref 65–99)
Potassium: 5.2 mmol/L — ABNORMAL HIGH (ref 3.5–5.1)
Sodium: 139 mmol/L (ref 135–145)

## 2016-10-15 LAB — CBC
HCT: 34.1 % — ABNORMAL LOW (ref 35.0–47.0)
HEMOGLOBIN: 11.2 g/dL — AB (ref 12.0–16.0)
MCH: 29.5 pg (ref 26.0–34.0)
MCHC: 32.8 g/dL (ref 32.0–36.0)
MCV: 89.7 fL (ref 80.0–100.0)
PLATELETS: 178 10*3/uL (ref 150–440)
RBC: 3.8 MIL/uL (ref 3.80–5.20)
RDW: 15.9 % — AB (ref 11.5–14.5)
WBC: 7.2 10*3/uL (ref 3.6–11.0)

## 2016-10-15 SURGERY — INSERTION, INTRAMEDULLARY ROD, FEMUR, RETROGRADE
Anesthesia: General | Laterality: Left | Wound class: Clean

## 2016-10-15 MED ORDER — DEXAMETHASONE SODIUM PHOSPHATE 10 MG/ML IJ SOLN
INTRAMUSCULAR | Status: DC | PRN
  Administered 2016-10-15: 8 mg via INTRAVENOUS

## 2016-10-15 MED ORDER — FENTANYL CITRATE (PF) 100 MCG/2ML IJ SOLN
INTRAMUSCULAR | Status: DC | PRN
  Administered 2016-10-15 (×2): 25 ug via INTRAVENOUS
  Administered 2016-10-15: 75 ug via INTRAVENOUS
  Administered 2016-10-15 (×3): 25 ug via INTRAVENOUS

## 2016-10-15 MED ORDER — FERROUS SULFATE 325 (65 FE) MG PO TABS
325.0000 mg | ORAL_TABLET | Freq: Two times a day (BID) | ORAL | Status: DC
  Administered 2016-10-16 – 2016-10-18 (×5): 325 mg via ORAL
  Filled 2016-10-15 (×5): qty 1

## 2016-10-15 MED ORDER — FENTANYL CITRATE (PF) 100 MCG/2ML IJ SOLN
INTRAMUSCULAR | Status: AC
  Filled 2016-10-15: qty 2

## 2016-10-15 MED ORDER — MENTHOL 3 MG MT LOZG
1.0000 | LOZENGE | OROMUCOSAL | Status: DC | PRN
  Filled 2016-10-15: qty 9

## 2016-10-15 MED ORDER — FENTANYL CITRATE (PF) 100 MCG/2ML IJ SOLN
INTRAMUSCULAR | Status: AC
  Administered 2016-10-15: 25 ug via INTRAVENOUS
  Filled 2016-10-15: qty 2

## 2016-10-15 MED ORDER — PROPOFOL 10 MG/ML IV BOLUS
INTRAVENOUS | Status: DC | PRN
  Administered 2016-10-15: 90 mg via INTRAVENOUS

## 2016-10-15 MED ORDER — OXYCODONE HCL 5 MG PO TABS
5.0000 mg | ORAL_TABLET | ORAL | Status: DC | PRN
  Administered 2016-10-15 – 2016-10-17 (×6): 10 mg via ORAL
  Administered 2016-10-17: 5 mg via ORAL
  Administered 2016-10-18 (×2): 10 mg via ORAL
  Administered 2016-10-18: 5 mg via ORAL
  Filled 2016-10-15: qty 2
  Filled 2016-10-15: qty 1
  Filled 2016-10-15 (×7): qty 2
  Filled 2016-10-15: qty 1

## 2016-10-15 MED ORDER — ENOXAPARIN SODIUM 40 MG/0.4ML ~~LOC~~ SOLN
40.0000 mg | SUBCUTANEOUS | Status: DC
  Administered 2016-10-16 – 2016-10-18 (×3): 40 mg via SUBCUTANEOUS
  Filled 2016-10-15 (×3): qty 0.4

## 2016-10-15 MED ORDER — MORPHINE SULFATE (PF) 2 MG/ML IV SOLN: 2.0000 mg | INTRAVENOUS | Status: DC | PRN

## 2016-10-15 MED ORDER — SUGAMMADEX SODIUM 200 MG/2ML IV SOLN
INTRAVENOUS | Status: AC
  Filled 2016-10-15: qty 2

## 2016-10-15 MED ORDER — PHENYLEPHRINE HCL 10 MG/ML IJ SOLN
INTRAMUSCULAR | Status: DC | PRN
  Administered 2016-10-15: 100 ug via INTRAVENOUS
  Administered 2016-10-15: 200 ug via INTRAVENOUS
  Administered 2016-10-15 (×2): 100 ug via INTRAVENOUS

## 2016-10-15 MED ORDER — NEOMYCIN-POLYMYXIN B GU 40-200000 IR SOLN
Status: DC | PRN
  Administered 2016-10-15: 4 mL

## 2016-10-15 MED ORDER — MORPHINE SULFATE (PF) 2 MG/ML IV SOLN
2.0000 mg | Freq: Four times a day (QID) | INTRAVENOUS | Status: DC | PRN
Start: 2016-10-15 — End: 2016-10-15
  Administered 2016-10-15: 2 mg via INTRAVENOUS
  Filled 2016-10-15: qty 1

## 2016-10-15 MED ORDER — ACETAMINOPHEN 10 MG/ML IV SOLN
INTRAVENOUS | Status: DC | PRN
  Administered 2016-10-15: 1000 mg via INTRAVENOUS

## 2016-10-15 MED ORDER — PROPOFOL 10 MG/ML IV BOLUS
INTRAVENOUS | Status: AC
  Filled 2016-10-15: qty 20

## 2016-10-15 MED ORDER — PHENOL 1.4 % MT LIQD
1.0000 | OROMUCOSAL | Status: DC | PRN
  Filled 2016-10-15: qty 177

## 2016-10-15 MED ORDER — OXYCODONE HCL 5 MG/5ML PO SOLN: 5.0000 mg | Freq: Once | ORAL | Status: DC | PRN

## 2016-10-15 MED ORDER — DEXAMETHASONE SODIUM PHOSPHATE 10 MG/ML IJ SOLN
INTRAMUSCULAR | Status: AC
  Filled 2016-10-15: qty 1

## 2016-10-15 MED ORDER — NEOMYCIN-POLYMYXIN B GU 40-200000 IR SOLN
Status: AC
  Filled 2016-10-15: qty 4

## 2016-10-15 MED ORDER — ACETAMINOPHEN 10 MG/ML IV SOLN
INTRAVENOUS | Status: AC
  Filled 2016-10-15: qty 100

## 2016-10-15 MED ORDER — SENNOSIDES-DOCUSATE SODIUM 8.6-50 MG PO TABS
1.0000 | ORAL_TABLET | Freq: Two times a day (BID) | ORAL | Status: DC
  Administered 2016-10-15 – 2016-10-18 (×6): 1 via ORAL
  Filled 2016-10-15 (×6): qty 1

## 2016-10-15 MED ORDER — CEFAZOLIN SODIUM-DEXTROSE 2-4 GM/100ML-% IV SOLN
2.0000 g | Freq: Four times a day (QID) | INTRAVENOUS | Status: DC
  Filled 2016-10-15 (×4): qty 100

## 2016-10-15 MED ORDER — ONDANSETRON HCL 4 MG PO TABS: 4.0000 mg | ORAL_TABLET | Freq: Four times a day (QID) | ORAL | Status: DC | PRN

## 2016-10-15 MED ORDER — MAGNESIUM HYDROXIDE 400 MG/5ML PO SUSP
30.0000 mL | Freq: Every day | ORAL | Status: DC | PRN
  Administered 2016-10-18: 30 mL via ORAL
  Filled 2016-10-15: qty 30

## 2016-10-15 MED ORDER — ACETAMINOPHEN 325 MG PO TABS
650.0000 mg | ORAL_TABLET | Freq: Four times a day (QID) | ORAL | Status: DC | PRN
  Administered 2016-10-17: 650 mg via ORAL

## 2016-10-15 MED ORDER — DEXTROSE 5 % IV SOLN
2.0000 g | Freq: Four times a day (QID) | INTRAVENOUS | Status: AC
  Administered 2016-10-15 – 2016-10-16 (×4): 2 g via INTRAVENOUS
  Filled 2016-10-15 (×4): qty 2000

## 2016-10-15 MED ORDER — METOCLOPRAMIDE HCL 10 MG PO TABS
10.0000 mg | ORAL_TABLET | Freq: Three times a day (TID) | ORAL | Status: AC
  Administered 2016-10-15 – 2016-10-17 (×8): 10 mg via ORAL
  Filled 2016-10-15 (×6): qty 1

## 2016-10-15 MED ORDER — ACETAMINOPHEN 650 MG RE SUPP: 650.0000 mg | Freq: Four times a day (QID) | RECTAL | Status: DC | PRN

## 2016-10-15 MED ORDER — SODIUM CHLORIDE 0.9 % IV SOLN
INTRAVENOUS | Status: DC
  Administered 2016-10-15 – 2016-10-16 (×2): via INTRAVENOUS

## 2016-10-15 MED ORDER — ONDANSETRON HCL 4 MG/2ML IJ SOLN
INTRAMUSCULAR | Status: DC | PRN
  Administered 2016-10-15: 4 mg via INTRAVENOUS

## 2016-10-15 MED ORDER — ROCURONIUM BROMIDE 100 MG/10ML IV SOLN
INTRAVENOUS | Status: DC | PRN
  Administered 2016-10-15: 40 mg via INTRAVENOUS
  Administered 2016-10-15: 10 mg via INTRAVENOUS

## 2016-10-15 MED ORDER — METOCLOPRAMIDE HCL 10 MG PO TABS
5.0000 mg | ORAL_TABLET | Freq: Three times a day (TID) | ORAL | Status: DC | PRN
  Filled 2016-10-15: qty 1

## 2016-10-15 MED ORDER — ROCURONIUM BROMIDE 100 MG/10ML IV SOLN
INTRAVENOUS | Status: AC
  Filled 2016-10-15: qty 1

## 2016-10-15 MED ORDER — OXYCODONE HCL 5 MG PO TABS: 5.0000 mg | ORAL_TABLET | Freq: Once | ORAL | Status: DC | PRN

## 2016-10-15 MED ORDER — SUGAMMADEX SODIUM 200 MG/2ML IV SOLN
INTRAVENOUS | Status: DC | PRN
  Administered 2016-10-15: 130 mg via INTRAVENOUS

## 2016-10-15 MED ORDER — BISACODYL 10 MG RE SUPP: 10.0000 mg | Freq: Every day | RECTAL | Status: DC | PRN

## 2016-10-15 MED ORDER — METOCLOPRAMIDE HCL 5 MG/ML IJ SOLN: 5.0000 mg | Freq: Three times a day (TID) | INTRAMUSCULAR | Status: DC | PRN

## 2016-10-15 MED ORDER — ONDANSETRON HCL 4 MG/2ML IJ SOLN: 4.0000 mg | Freq: Four times a day (QID) | INTRAMUSCULAR | Status: DC | PRN

## 2016-10-15 MED ORDER — FLEET ENEMA 7-19 GM/118ML RE ENEM: 1.0000 | ENEMA | Freq: Once | RECTAL | Status: DC | PRN

## 2016-10-15 MED ORDER — ACETAMINOPHEN 10 MG/ML IV SOLN
1000.0000 mg | Freq: Four times a day (QID) | INTRAVENOUS | Status: AC
  Administered 2016-10-15 – 2016-10-16 (×4): 1000 mg via INTRAVENOUS
  Filled 2016-10-15 (×4): qty 100

## 2016-10-15 MED ORDER — TRAMADOL HCL 50 MG PO TABS
50.0000 mg | ORAL_TABLET | ORAL | Status: DC | PRN
Start: 2016-10-15 — End: 2016-10-16

## 2016-10-15 MED ORDER — FENTANYL CITRATE (PF) 100 MCG/2ML IJ SOLN
25.0000 ug | INTRAMUSCULAR | Status: DC | PRN
  Administered 2016-10-15 (×4): 25 ug via INTRAVENOUS

## 2016-10-15 MED ORDER — PROPOFOL 500 MG/50ML IV EMUL
INTRAVENOUS | Status: AC
  Filled 2016-10-15: qty 50

## 2016-10-15 MED ORDER — ONDANSETRON HCL 4 MG/2ML IJ SOLN
INTRAMUSCULAR | Status: AC
  Filled 2016-10-15: qty 2

## 2016-10-15 SURGICAL SUPPLY — 55 items
BANDAGE ELASTIC 6 LF NS (GAUZE/BANDAGES/DRESSINGS) ×3 IMPLANT
BIT DRILL CALIBRATED 4.3MMX365 (DRILL) ×1 IMPLANT
BIT DRILL CROWE PNT TWST 4.5MM (DRILL) ×1 IMPLANT
BNDG COHESIVE 6X5 TAN STRL LF (GAUZE/BANDAGES/DRESSINGS) ×3 IMPLANT
CANISTER SUCT 1200ML W/VALVE (MISCELLANEOUS) ×3 IMPLANT
CAST PADDING 6X4YD ST 30248 (SOFTGOODS) ×4
COOLER POLAR GLACIER W/PUMP (MISCELLANEOUS) IMPLANT
DRAPE C-ARM XRAY 36X54 (DRAPES) ×3 IMPLANT
DRAPE C-ARMOR (DRAPES) ×3 IMPLANT
DRAPE U-SHAPE 47X51 STRL (DRAPES) ×3 IMPLANT
DRESSING ALLEVYN LIFE SACRUM (GAUZE/BANDAGES/DRESSINGS) ×3 IMPLANT
DRILL CALIBRATED 4.3MMX365 (DRILL) ×3
DRILL CROWE POINT TWIST 4.5MM (DRILL) ×3
DRSG DERMACEA 8X12 NADH (GAUZE/BANDAGES/DRESSINGS) ×3 IMPLANT
DRSG OPSITE POSTOP 3X4 (GAUZE/BANDAGES/DRESSINGS) ×9 IMPLANT
DRSG OPSITE POSTOP 4X8 (GAUZE/BANDAGES/DRESSINGS) ×3 IMPLANT
DURAPREP 26ML APPLICATOR (WOUND CARE) ×6 IMPLANT
ELECT CAUTERY BLADE TIP 2.5 (TIP) ×3
ELECT REM PT RETURN 9FT ADLT (ELECTROSURGICAL) ×3
ELECTRODE CAUTERY BLDE TIP 2.5 (TIP) ×1 IMPLANT
ELECTRODE REM PT RTRN 9FT ADLT (ELECTROSURGICAL) ×1 IMPLANT
GAUZE SPONGE 4X4 12PLY STRL (GAUZE/BANDAGES/DRESSINGS) ×3 IMPLANT
GLOVE BIOGEL M STRL SZ7.5 (GLOVE) ×6 IMPLANT
GLOVE BIOGEL PI IND STRL 7.0 (GLOVE) ×2 IMPLANT
GLOVE BIOGEL PI INDICATOR 7.0 (GLOVE) ×4
GLOVE INDICATOR 8.0 STRL GRN (GLOVE) ×3 IMPLANT
GLOVE SURG SYN 7.0 (GLOVE) ×3 IMPLANT
GOWN STRL REUS W/ TWL LRG LVL3 (GOWN DISPOSABLE) ×3 IMPLANT
GOWN STRL REUS W/TWL LRG LVL3 (GOWN DISPOSABLE) ×6
GUIDEPIN 3.2X17.5 THRD DISP (PIN) ×3 IMPLANT
GUIDEWIRE BEAD TIP (WIRE) ×3 IMPLANT
KIT RM TURNOVER STRD PROC AR (KITS) ×3 IMPLANT
MAT BLUE FLOOR 46X72 FLO (MISCELLANEOUS) ×3 IMPLANT
NAIL FEM RETRO 12X380 (Nail) ×3 IMPLANT
NEEDLE FILTER BLUNT 18X 1/2SAF (NEEDLE) ×2
NEEDLE FILTER BLUNT 18X1 1/2 (NEEDLE) ×1 IMPLANT
NS IRRIG 1000ML POUR BTL (IV SOLUTION) ×3 IMPLANT
PACK HIP PROSTHESIS (MISCELLANEOUS) ×3 IMPLANT
PACK TOTAL KNEE (MISCELLANEOUS) ×3 IMPLANT
PAD WRAPON POLAR KNEE (MISCELLANEOUS) IMPLANT
PADDING CAST COTTON 6X4 ST (SOFTGOODS) ×2 IMPLANT
SCREW CORT TI DBL LEAD 5X36 (Screw) ×3 IMPLANT
SCREW CORT TI DBL LEAD 5X42 (Screw) ×3 IMPLANT
SCREW CORT TI DBL LEAD 5X56 (Screw) ×3 IMPLANT
SCREW CORT TI DBL LEAD 5X58 (Screw) ×3 IMPLANT
SCREW CORT TI DBL LEAD 5X70 (Screw) ×6 IMPLANT
SOL PREP PVP 2OZ (MISCELLANEOUS) ×3
SOLUTION PREP PVP 2OZ (MISCELLANEOUS) ×1 IMPLANT
STAPLER SKIN PROX 35W (STAPLE) ×3 IMPLANT
STOCKINETTE M/LG 89821 (MISCELLANEOUS) ×3 IMPLANT
SUT VIC AB 0 CT1 36 (SUTURE) ×3 IMPLANT
SUT VIC AB 1 CT1 36 (SUTURE) ×3 IMPLANT
SUT VIC AB 2-0 CT1 (SUTURE) ×3 IMPLANT
SYRINGE 10CC LL (SYRINGE) ×3 IMPLANT
WRAPON POLAR PAD KNEE (MISCELLANEOUS)

## 2016-10-15 NOTE — Op Note (Signed)
OPERATIVE NOTE  DATE OF SURGERY:   10/15/2016  PATIENT NAME:  Sarah Lowery   DOB: 01/22/1946  MRN: [id number]   PRE-OPERATIVE DIAGNOSIS:  Left distal femoral shaft fracture  POST-OPERATIVE DIAGNOSIS:  Same  PROCEDURE:  Open reduction and internal fixation of a left distal femoral shaft fracture  SURGEON:  Jena GaussJames P Hooten, Jr., M.D.   ASSISTANT: None  ANESTHESIA: general  ESTIMATED BLOOD LOSS: 500 mL  FLUIDS REPLACED: 1100 mL of crystalloid  TOURNIQUET TIME: Not used   DRAINS: None  IMPLANTS UTILIZED: Biomet 12 mm x 380 mm Phoenix retrograde femoral nail, 6 - 5.0 mm cortical screws  INDICATIONS FOR SURGERY: Sarah EonRosalind Machi is a 71 y.o. year old female who sustained a fall resulting in a left distal femoral shaft fracture. After discussion of the risks and benefits of surgical intervention, the patient expressed understanding of the risks benefits and agree with plans for open reduction and internal fixation.   PROCEDURE IN DETAIL: The patient was brought into the operating room and, after adequate general anesthesia was achieved, the patient's left leg was cleaned and prepped with alcohol and DuraPrep and draped in the usual sterile fashion. A "timeout" was performed as per usual protocol. A provisional reduction was performed and verified using the C-arm. An anterior incision was made just medial to the patellar tendon. Dissection was carried down to the joint. A large hemarthrosis was evacuated. A guidepin was used to locate the entry site for the retrograde nail using bone incised in line as a reference on the lateral views. Position was confirmed in both AP and lateral planes. A conical reamers then used to enlarge the opening. The initial guide pin was removed and a beaded reaming guide rod was inserted in retrograde fashion down the femoral canal. Good position was noted on multiple views using the C-arm. Measurements were obtained and it was felt that a 380 mm nail was the  appropriate length. The femoral canal was reamed in a sequential fashion up to a 13.5 mm diameter. A Biomet 12 mm x 380 mm Phoenix retrograde femoral nail was advanced over the guidewire. The guidewire was then removed. Good position and the implant was noted and reasonable reduction at the fracture site was appreciated in multiple views. The nail was locked distally using four 5.0 mm cortical screws. The locking device was then engaged distally. The C-arm was then positioned so as to visualize the 2 proximal locking holes. A stab incision was made and two 5.0 mm screws were inserted accordingly.  The wounds were irrigated with copious amounts of saline with MRI solution. Good hemostasis was appreciated. The anterior incision was then approximated in layers using first #1 Vicryl for closure of the fascia followed by interrupted sutures of #0 Vicryl. The final layer was closed using #2-0 Vicryl. The stab incisions were similarly closed using #2-0 Vicryl. Sterile dressings were applied.  The patient tolerated the procedure well. She was transported to the recovery room in stable condition.   James P. Angie FavaHooten, Jr. M.D.

## 2016-10-15 NOTE — Anesthesia Post-op Follow-up Note (Cosign Needed)
Anesthesia QCDR form completed.        

## 2016-10-15 NOTE — Anesthesia Preprocedure Evaluation (Signed)
Anesthesia Evaluation  Patient identified by MRN, date of birth, ID band Patient awake    Reviewed: Allergy & Precautions, H&P , NPO status , Patient's Chart, lab work & pertinent test results  History of Anesthesia Complications Negative for: history of anesthetic complications  Airway Mallampati: III  TM Distance: <3 FB Neck ROM: limited    Dental  (+) Poor Dentition, Chipped, Caps   Pulmonary neg shortness of breath, former smoker,    Pulmonary exam normal breath sounds clear to auscultation       Cardiovascular Exercise Tolerance: Good hypertension, (-) angina+CHF  (-) Past MI and (-) DOE Normal cardiovascular exam Rhythm:regular Rate:Normal     Neuro/Psych negative neurological ROS  negative psych ROS   GI/Hepatic Neg liver ROS, GERD  Controlled,  Endo/Other  Hypothyroidism   Renal/GU      Musculoskeletal  (+) Arthritis ,   Abdominal   Peds  Hematology negative hematology ROS (+)   Anesthesia Other Findings Past Medical History: No date: Anemia No date: Arthritis No date: Diastolic CHF (HCC) No date: GERD (gastroesophageal reflux disease) No date: Gout No date: H/O ETOH abuse No date: Hyperlipemia No date: Hypertension No date: Hypothyroidism No date: Liver cirrhosis (HCC) No date: OA (osteoarthritis)  Past Surgical History: No date: ABDOMINAL HYSTERECTOMY No date: CATARACT EXTRACTION No date: CESAREAN SECTION No date: KNEE CARTILAGE SURGERY Bilateral  BMI    Body Mass Index:  22.44 kg/m      Reproductive/Obstetrics negative OB ROS                             Anesthesia Physical Anesthesia Plan  ASA: III  Anesthesia Plan: General ETT   Post-op Pain Management:    Induction: Intravenous  Airway Management Planned: Oral ETT  Additional Equipment:   Intra-op Plan:   Post-operative Plan: Extubation in OR  Informed Consent: I have reviewed the patients  History and Physical, chart, labs and discussed the procedure including the risks, benefits and alternatives for the proposed anesthesia with the patient or authorized representative who has indicated his/her understanding and acceptance.   Dental Advisory Given  Plan Discussed with: Anesthesiologist, CRNA and Surgeon  Anesthesia Plan Comments: (Patient declines spinal  Patient consented for risks of anesthesia including but not limited to:  - adverse reactions to medications - damage to teeth, lips or other oral mucosa - sore throat or hoarseness - Damage to heart, brain, lungs or loss of life  Patient voiced understanding.)        Anesthesia Quick Evaluation

## 2016-10-15 NOTE — OR Nursing (Signed)
Sarah LarssonBhavini will check to see if sacral drsg is on pt when she is transforred to OR bed; Ancef 2 gm to OR with patient

## 2016-10-15 NOTE — OR Nursing (Signed)
poloar care taken to PACU as requested by Rachel BoB Patel, RN

## 2016-10-15 NOTE — Progress Notes (Addendum)
[facility] - [place] [facility]   PATIENT NAME: Sarah Lowery    MR#:  [id number]  DATE OF BIRTH:  30-Oct-1945  SUBJECTIVE:  Came in after knees gave out and fell at home  REVIEW OF SYSTEMS:   Review of Systems  Constitutional: Negative for chills, fever and weight loss.  HENT: Negative for ear discharge, ear pain and nosebleeds.   Eyes: Negative for blurred vision, pain and discharge.  Respiratory: Negative for sputum production, shortness of breath, wheezing and stridor.   Cardiovascular: Negative for chest pain, palpitations, orthopnea and PND.  Gastrointestinal: Negative for abdominal pain, diarrhea, nausea and vomiting.  Genitourinary: Negative for frequency and urgency.  Musculoskeletal: Positive for joint pain. Negative for back pain.  Neurological: Positive for weakness. Negative for sensory change, speech change and focal weakness.  Psychiatric/Behavioral: Negative for depression and hallucinations. The patient is not nervous/anxious.    Tolerating Diet:npo for sx Tolerating PT: pending  DRUG ALLERGIES:   Allergies  Allergen Reactions  . Hydrochlorothiazide Other (See Comments)    Causes hyponatremia if used concomitantly with furosemide  . Ace Inhibitors Other (See Comments)    cough  . Isosorbide Nitrate     headache  . Nsaids     Kidney Disease    VITALS:  Blood pressure (!) 182/98, pulse 70, temperature 98.4 F (36.9 C), temperature source Tympanic, resp. rate 16, height 5\' 6"  (1.676 m), weight 63 kg (139 lb), SpO2 99 %.  PHYSICAL EXAMINATION:   Physical Exam  GENERAL:  71 y.o.-year-old patient lying in the bed with no acute distress.  EYES: Pupils equal, round, reactive to light and accommodation. No scleral icterus. Extraocular muscles intact.  HEENT: Head atraumatic, normocephalic. Oropharynx and nasopharynx clear.  NECK:  Supple, no jugular venous distention. No thyroid enlargement, no tenderness.  LUNGS: Normal  breath sounds bilaterally, no wheezing, rales, rhonchi. No use of accessory muscles of respiration.  CARDIOVASCULAR: S1, S2 normal. No murmurs, rubs, or gallops.  ABDOMEN: Soft, nontender, nondistended. Bowel sounds present. No organomegaly or mass.  EXTREMITIES: No cyanosis, clubbing or edema b/l.   Left LE buck's traction NEUROLOGIC: Cranial nerves II through XII are intact. No focal Motor or sensory deficits b/l.   PSYCHIATRIC:  patient is alert and oriented x 3.  SKIN: No obvious rash, lesion, or ulcer.   LABORATORY PANEL:  CBC  Recent Labs Lab 10/15/16 0812  WBC 7.2  HGB 11.2*  HCT 34.1*  PLT 178    Chemistries   Recent Labs Lab 10/15/16 0721  NA 139  K 5.2*  CL 111  CO2 21*  GLUCOSE 98  BUN 11  CREATININE 1.55*  CALCIUM 8.6*   Cardiac Enzymes No results for input(s): TROPONINI in the last 168 hours. RADIOLOGY:  Dg Chest Port 1 View  Result Date: 10/14/2016 CLINICAL DATA:  Status post fall at home today. History of CHF, hypertension, cirrhosis, former smoker. EXAM: PORTABLE CHEST 1 VIEW COMPARISON:  None in PACs FINDINGS: The patient is rotated to the right on this study. There is mild elevation of the left hemidiaphragm Wood's which limits excursion of the left lung. The aerated portions of both lungs are clear. The heart is top-normal in size. The pulmonary vascularity is not engorged. The bony thorax exhibits no acute abnormality. IMPRESSION: No pneumonia nor pulmonary edema. Mild elevation of the left hemidiaphragm of uncertain age. Electronically Signed   By: David  [place] M.D.   On: 10/14/2016 10:55   Dg Knee Left Port  Result Date: 10/14/2016 CLINICAL DATA:  Left knee pain after fall today at home. EXAM: PORTABLE LEFT KNEE - 1-2 VIEW COMPARISON:  None. FINDINGS: Severely displaced spiral fracture of distal left femur is noted. Severe degenerative joint disease is noted medially in the left knee. No soft tissue abnormality is noted. IMPRESSION: Severely displaced  spiral fracture of distal left femur shaft. Electronically Signed   By: Lupita RaiderJames  Green Jr, M.D.   On: 10/14/2016 09:25   Dg Femur Portable Min 2 Views Left  Result Date: 10/14/2016 CLINICAL DATA:  Left femur pain after fall today at home. EXAM: LEFT FEMUR PORTABLE 2 VIEWS COMPARISON:  None. FINDINGS: Severely displaced spiral fracture is seen involving the distal left femoral shaft. Severe degenerative changes seen involving the left knee joint medially. Left hip is unremarkable. IMPRESSION: Severely displaced spiral fracture involving the distal left femoral shaft. Electronically Signed   By: Lupita RaiderJames  Green Jr, M.D.   On: 10/14/2016 09:24   ASSESSMENT AND PLAN:  Sarah Lowery  is a 71 y.o. female with a known history of  Osteoarthritis, history of alcohol abuse in the past, history of liver cirrhosis., Diastolic CHF, gout, hyperlipidemia, essential hypertension and hypothyroidism who states that she's been having trouble with both her knees for the past few months and and has been wearing a knee brace intermittently. Who woke up around 1 AM as well as go to the bathroom.   1. Left-sided hip fracture status post mechanical fall Patient at moderate risk of surgical complications. -Received IV hydration for today due to renal failure. - No further cardiopulmonary workup needed.  2. Acute renal failure suspect due to dehydration and diuresis -Continue with IV hydration. Stop her Lasix -Came in with creatinine of 2.10--- 1.5 -Avoid nephrotoxins  3. Essential hypertension - we will continue her Coreg -Hold losartan due to elevated creatinine Blood pressure currently elevated due to pain will use IV when necessary hydralazine  4. Hypothyroidism continue Synthroid  5. Hyperlipidemia unspecified continue Crestor  Physical therapy, Child psychotherapistsocial worker for Discharge planning   Case discussed with Care Management/Social Worker. Management plans discussed with the patient, family and they are in  agreement.  CODE STATUS: Full  DVT Prophylaxis: Lovenox after surgery  TOTAL TIME TAKING CARE OF THIS PATIENT: 40  minutes.  >50% time spent on counselling and coordination of care  POSSIBLE D/C IN 1-2 DAYS, DEPENDING ON CLINICAL CONDITION.  Note: This dictation was prepared with Dragon dictation along with smaller phrase technology. Any transcriptional errors that result from this process are unintentional.  Temeka Pore M.D on 10/15/2016 at 1:02 PM  Between 7am to 6pm - Pager - [phone]  After 6pm go to [email or web address] - Social research officer, governmentpassword [email or web address] [email or web address]  [facility]  Office  [phone]  CC: Primary care physician; Orland Mustardietz, Ashley M, MD

## 2016-10-15 NOTE — Care Management Important Message (Signed)
Important Message  Patient Details  Name: Sarah Lowery MRN: [id number] Date of Birth: 03/04/1946   Medicare Important Message Given:  Yes    [name], RN 10/15/2016, 9:38 AM

## 2016-10-15 NOTE — Transfer of Care (Signed)
Immediate Anesthesia Transfer of Care Note  Patient: Sarah Lowery  Procedure(s) Performed: Procedure(s): INTRAMEDULLARY (IM) RETROGRADE FEMORAL NAILING (Left)  Patient Location: PACU  Anesthesia Type:General  Level of Consciousness: sedated  Airway & Oxygen Therapy: Patient Spontanous Breathing and Patient connected to face mask oxygen  Post-op Assessment: Report given to RN and Post -op Vital signs reviewed and stable  Post vital signs: Reviewed and stable  Last Vitals:  Vitals:   10/15/16 1241 10/15/16 1725  BP: (!) 182/98 (!) 211/94  Pulse: 70 74  Resp: 16 12  Temp: 36.9 C 37.1 C    Last Pain:  Vitals:   10/15/16 1241  TempSrc: Tympanic  PainSc: 8          Complications: No apparent anesthesia complications

## 2016-10-15 NOTE — Anesthesia Procedure Notes (Signed)
Procedure Name: Intubation Date/Time: 10/15/2016 1:49 PM Performed by: Marlana SalvageJESSUP, Cage Gupton Pre-anesthesia Checklist: Patient identified, Emergency Drugs available, Suction available, Patient being monitored and Timeout performed Patient Re-evaluated:Patient Re-evaluated prior to inductionOxygen Delivery Method: Circle system utilized Preoxygenation: Pre-oxygenation with 100% oxygen Intubation Type: IV induction Ventilation: Mask ventilation without difficulty Laryngoscope size: McGrath 3. Grade View: Grade III Tube size: 7.0 mm Number of attempts: 3 (multiple attempts to get view with McGrath but only 2 attempts to pass tube with.  ) Airway Equipment and Method: Video-laryngoscopy,  Rigid stylet and Bite block Placement Confirmation: ETT inserted through vocal cords under direct vision,  positive ETCO2 and breath sounds checked- equal and bilateral Secured at: 22 cm Tube secured with: Tape Dental Injury: Teeth and Oropharynx as per pre-operative assessment  Difficulty Due To: Difficulty was anticipated Comments: McGrath used, would recommend Glidescope.

## 2016-10-16 LAB — BASIC METABOLIC PANEL
Anion gap: 6 (ref 5–15)
BUN: 12 mg/dL (ref 6–20)
CHLORIDE: 103 mmol/L (ref 101–111)
CO2: 25 mmol/L (ref 22–32)
Calcium: 8 mg/dL — ABNORMAL LOW (ref 8.9–10.3)
Creatinine, Ser: 1.33 mg/dL — ABNORMAL HIGH (ref 0.44–1.00)
GFR calc Af Amer: 45 mL/min — ABNORMAL LOW (ref >60)
GFR calc non Af Amer: 39 mL/min — ABNORMAL LOW (ref >60)
Glucose, Bld: 200 mg/dL — ABNORMAL HIGH (ref 65–99)
POTASSIUM: 3.9 mmol/L (ref 3.5–5.1)
Sodium: 134 mmol/L — ABNORMAL LOW (ref 135–145)

## 2016-10-16 LAB — CBC
HEMATOCRIT: 29.6 % — AB (ref 35.0–47.0)
HEMOGLOBIN: 9.9 g/dL — AB (ref 12.0–16.0)
MCH: 29.9 pg (ref 26.0–34.0)
MCHC: 33.4 g/dL (ref 32.0–36.0)
MCV: 89.5 fL (ref 80.0–100.0)
Platelets: 161 10*3/uL (ref 150–440)
RBC: 3.31 MIL/uL — AB (ref 3.80–5.20)
RDW: 15.6 % — ABNORMAL HIGH (ref 11.5–14.5)
WBC: 7.3 10*3/uL (ref 3.6–11.0)

## 2016-10-16 NOTE — Progress Notes (Signed)
OT Cancellation Note  Patient Details Name: Sarah Lowery MRN: [id number] DOB: 12/07/1945   Cancelled Treatment:    Reason Eval/Treat Not Completed: Patient declined, no reason specified. Order received, chart reviewed. Pt ambulating back to bed with RN using RW with min guard. Pt states that she had not had lunch yet and requested OT come back at later date/time after she had a chance to eat and rest. Will re-attempt OT evaluation at later date/time as schedule allows.  Richrd PrimeJamie Stiller, MPH, MS, OTR/L ascom [phone] 10/16/16, 1:55 PM

## 2016-10-16 NOTE — Evaluation (Signed)
Occupational Therapy Evaluation Patient Details Name: Sarah Lowery MRN: [id number] DOB: [birth year] Today's Date: 10/16/2016    History of Present Illness Sarah Lowery  is a 71 y.o. female POD #1 L IM nailing.    Clinical Impression   Pt seen for OT evaluation this date. Pt in bed, fatigued and with 9/10 pain in LLE, but agreeable to bedside OT session. Pt presents with pain, fatigue, decreased strength/ROM in LLE, limited ROM in shoulders (approx 75% of ROM, but still able to perform ADL to her satisfaction), and decreased knowledge of AE for self care tasks. Pt with history of multiple falls, using walker and wc for mobility in the home PTA. Pt educated in use of AE for LB self care tasks. Pt verbalized understanding. Would benefit from additional training to ensure recall and carryover of safe techniques. Pt declining SNF recommendation at this time. Noted that she would be open to home health services in her apartment. Concern for return home due to limited caregiver assist available (granddaughter works). Pt will benefit from skilled OT services to address noted impairments and functional deficits in order to maximize functional return to PLOF and minimize falls risk/injury/rehospitalization.     Follow Up Recommendations  SNF    Equipment Recommendations  3 in 1 bedside commode    Recommendations for Other Services       Precautions / Restrictions Precautions Precautions: Fall Required Braces or Orthoses: Knee Immobilizer - Left Restrictions Weight Bearing Restrictions: Yes LLE Weight Bearing: Partial weight bearing      Mobility Bed Mobility               General bed mobility comments: pt declined out of bed/bed mobility  Transfers                 General transfer comment: pt declined out of bed due to pain/fatigue    Balance Overall balance assessment: History of Falls                                         ADL either  performed or assessed with clinical judgement   ADL Overall ADL's : Needs assistance/impaired Eating/Feeding: Sitting;Set up               Upper Body Dressing : Sitting;Set up;Supervision/safety   Lower Body Dressing: Bed level;Moderate assistance;Minimal assistance Lower Body Dressing Details (indicate cue type and reason): pt educated in use of AE for LB ADL tasks and pt verbalizes understanding, declining to attempt during session due to fatigue/pain   Toilet Transfer Details (indicate cue type and reason): deferred due to pain/fatigue                 Vision Baseline Vision/History: Wears glasses Wears Glasses: At all times Patient Visual Report: No change from baseline Vision Assessment?: No apparent visual deficits     Perception     Praxis      Pertinent Vitals/Pain Pain Assessment: 0-10 Pain Score: 9  Pain Location: LLE Pain Descriptors / Indicators: Aching Pain Intervention(s): Limited activity within patient's tolerance;Monitored during session     Hand Dominance     Extremity/Trunk Assessment Upper Extremity Assessment Upper Extremity Assessment: Generalized weakness (grossly 3+/5 bilaterally, shoulder flexion bilaterally approx 75% ROM, which pt reports is improved )   Lower Extremity Assessment Lower Extremity Assessment: Defer to PT evaluation;LLE deficits/detail   Cervical / Trunk Assessment Cervical /  Trunk Assessment: Normal   Communication Communication Communication: No difficulties   Cognition Arousal/Alertness: Awake/alert Behavior During Therapy: WFL for tasks assessed/performed Overall Cognitive Status: Within Functional Limits for tasks assessed                                     General Comments  KI in place    Exercises     Shoulder Instructions      Home Living Family/patient expects to be discharged to:: Private residence Living Arrangements: Other (Comment) (granddaughter lives with her) Available  Help at Discharge: Family;Available PRN/intermittently (granddaugher works during the day) Type of Home: Apartment Home Access: Level entry     Home Layout: One level     Bathroom Shower/Tub: Walk-in Pensions consultantshower;Curtain   Bathroom Toilet: Handicapped height (with toilet riser) Bathroom Accessibility: Yes How Accessible: Accessible via walker Home Equipment: Walker - 4 wheels;Wheelchair - manual;Cane - single point;Shower seat          Prior Functioning/Environment Level of Independence: Independent with assistive device(s)        Comments: Pt limited at baseline by weak R LE/knee, uses RW and w/c for mobility; has life alert pendant but wasn't wearing it at the time of her fall; endorses 3 falls in past 12 months        OT Problem List: Decreased strength;Pain;Decreased range of motion;Decreased knowledge of use of DME or AE      OT Treatment/Interventions: Self-care/ADL training;Therapeutic exercise;Therapeutic activities;DME and/or AE instruction;Energy conservation;Patient/family education    OT Goals(Current goals can be found in the care plan section) Acute Rehab OT Goals Patient Stated Goal: go home with home health OT Goal Formulation: With patient Time For Goal Achievement: 10/30/16 Potential to Achieve Goals: Fair ADL Goals Pt Will Perform Lower Body Dressing: with supervision;with set-up;with adaptive equipment;sitting/lateral leans;sit to/from stand Pt Will Transfer to Toilet: with min guard assist;ambulating (amb w/ RW, BSC over toilet)  OT Frequency: Min 1X/week   Barriers to D/C: Decreased caregiver support          Co-evaluation              AM-PAC PT "6 Clicks" Daily Activity     Outcome Measure Help from another person eating meals?: None Help from another person taking care of personal grooming?: None Help from another person toileting, which includes using toliet, bedpan, or urinal?: A Little Help from another person bathing (including  washing, rinsing, drying)?: A Little Help from another person to put on and taking off regular upper body clothing?: A Little Help from another person to put on and taking off regular lower body clothing?: A Lot 6 Click Score: 19   End of Session    Activity Tolerance: Patient limited by pain;Patient limited by fatigue Patient left: in bed;with call bell/phone within reach;with bed alarm set;Other (comment) (KI in place)  OT Visit Diagnosis: Other abnormalities of gait and mobility (R26.89);History of falling (Z91.81);Muscle weakness (generalized) (M62.81);Pain Pain - Right/Left: Left Pain - part of body: Leg                Time: [id number] OT Time Calculation (min): 19 min Charges:  OT General Charges $OT Visit: 1 Procedure OT Evaluation $OT Eval Low Complexity: 1 Procedure OT Treatments $Self Care/Home Management : 8-22 mins G-Codes:     Richrd PrimeJamie Stiller, MPH, MS, OTR/L ascom [phone] 10/16/16, 3:18 PM

## 2016-10-16 NOTE — Progress Notes (Signed)
[facility] [place] [facility]   PATIENT NAME: Sarah Lowery    MR#:  [id number]  DATE OF BIRTH:  04/14/1946  SUBJECTIVE:  Came in after knees gave out and fell at home POD #1 REVIEW OF SYSTEMS:   Review of Systems  Constitutional: Negative for chills, fever and weight loss.  HENT: Negative for ear discharge, ear pain and nosebleeds.   Eyes: Negative for blurred vision, pain and discharge.  Respiratory: Negative for sputum production, shortness of breath, wheezing and stridor.   Cardiovascular: Negative for chest pain, palpitations, orthopnea and PND.  Gastrointestinal: Negative for abdominal pain, diarrhea, nausea and vomiting.  Genitourinary: Negative for frequency and urgency.  Musculoskeletal: Positive for joint pain. Negative for back pain.  Neurological: Positive for weakness. Negative for sensory change, speech change and focal weakness.  Psychiatric/Behavioral: Negative for depression and hallucinations. The patient is not nervous/anxious.    Tolerating Diet:npo for sx Tolerating PT:SNF  DRUG ALLERGIES:   Allergies  Allergen Reactions  . Hydrochlorothiazide Other (See Comments)    Causes hyponatremia if used concomitantly with furosemide  . Ace Inhibitors Other (See Comments)    cough  . Isosorbide Nitrate     headache  . Nsaids     Kidney Disease    VITALS:  Blood pressure 130/84, pulse 64, temperature 97.8 F (36.6 C), temperature source Oral, resp. rate 18, height 5\' 6"  (1.676 m), weight 63 kg (139 lb), SpO2 98 %.  PHYSICAL EXAMINATION:   Physical Exam  GENERAL:  71 y.o.-year-old patient lying in the bed with no acute distress.  EYES: Pupils equal, round, reactive to light and accommodation. No scleral icterus. Extraocular muscles intact.  HEENT: Head atraumatic, normocephalic. Oropharynx and nasopharynx clear.  NECK:  Supple, no jugular venous distention. No thyroid enlargement, no tenderness.  LUNGS: Normal breath  sounds bilaterally, no wheezing, rales, rhonchi. No use of accessory muscles of respiration.  CARDIOVASCULAR: S1, S2 normal. No murmurs, rubs, or gallops.  ABDOMEN: Soft, nontender, nondistended. Bowel sounds present. No organomegaly or mass.  EXTREMITIES: No cyanosis, clubbing or edema b/l.   Left LE surgical dressing+ NEUROLOGIC: Cranial nerves II through XII are intact. No focal Motor or sensory deficits b/l.   PSYCHIATRIC:  patient is alert and oriented x 3.  SKIN: No obvious rash, lesion, or ulcer.   LABORATORY PANEL:  CBC  Recent Labs Lab 10/16/16 0344  WBC 7.3  HGB 9.9*  HCT 29.6*  PLT 161    Chemistries   Recent Labs Lab 10/16/16 0344  NA 134*  K 3.9  CL 103  CO2 25  GLUCOSE 200*  BUN 12  CREATININE 1.33*  CALCIUM 8.0*   Cardiac Enzymes No results for input(s): TROPONINI in the last 168 hours. RADIOLOGY:  Dg C-arm 61-120 Min  Result Date: 10/15/2016 CLINICAL DATA:  Left distal femur spiral fracture, operative fixation EXAM: LEFT FEMUR 2 VIEWS; DG C-ARM 61-120 MIN COMPARISON:  10/14/2016 FINDINGS: Three spot fluoroscopic intraoperative views demonstrate retrograde intramedullary rod insertion traversing the left distal femur spiral fracture with improved alignment. 4 distal fixation screws evident. One proximal fixation screw. Advanced left knee tricompartmental osteoarthritis noted, most pronounced in the medial compartment with bone-on-bone contact, deformity, sclerosis and osteophytes. IMPRESSION: Status post ORIF of the left femur distal spiral fracture with improved alignment. Advanced left knee tricompartmental osteoarthritis. Electronically Signed   By: Judie Petit.  Shick M.D.   On: 10/15/2016 16:59   Dg Femur Min 2 Views Left  Result Date: 10/15/2016 CLINICAL DATA:  Left distal femur spiral fracture, operative fixation EXAM: LEFT FEMUR 2 VIEWS; DG C-ARM 61-120 MIN COMPARISON:  10/14/2016 FINDINGS: Three spot fluoroscopic intraoperative views demonstrate retrograde  intramedullary rod insertion traversing the left distal femur spiral fracture with improved alignment. 4 distal fixation screws evident. One proximal fixation screw. Advanced left knee tricompartmental osteoarthritis noted, most pronounced in the medial compartment with bone-on-bone contact, deformity, sclerosis and osteophytes. IMPRESSION: Status post ORIF of the left femur distal spiral fracture with improved alignment. Advanced left knee tricompartmental osteoarthritis. Electronically Signed   By: Judie PetitM.  Shick M.D.   On: 10/15/2016 16:59   Dg Femur Port Min 2 Views Left  Result Date: 10/15/2016 CLINICAL DATA:  Femoral shaft fracture post femoral hardware placement. EXAM: LEFT FEMUR PORTABLE 2 VIEWS COMPARISON:  Preoperative radiographs yesterday. FINDINGS: Intramedullary nail with proximal and distal locking screws traverses displaced distal femur fracture. There is improved fracture alignment from preoperative exam with mild residual displacement. Recent postsurgical change includes the skin staples proximal and distally. Overlying brace in place about the knee. Advanced knee osteoarthritis. IMPRESSION: ORIF distal femur fracture with intramedullary nail and locking screws. Improved fracture alignment from preoperative exam. Electronically Signed   By: Rubye OaksMelanie  Ehinger M.D.   On: 10/15/2016 18:34   ASSESSMENT AND PLAN:  Sarah Lowery  is a 71 y.o. female with a known history of  Osteoarthritis, history of alcohol abuse in the past, history of liver cirrhosis., Diastolic CHF, gout, hyperlipidemia, essential hypertension and hypothyroidism who states that she's been having trouble with both her knees for the past few months and and has been wearing a knee brace intermittently. Who woke up around 1 AM as well as go to the bathroom.   1. Left-sided femoral fracture status post mechanical fall Patient at moderate risk of surgical complications. - No further cardiopulmonary workup needed. -POD#1 Open  reduction and internal fixation of a left distal femoral shaft fracture by Dr Ernest Pinehooten  2. Acute renal failure suspect due to dehydration and diuresis -Continue with IV hydration. Stop her Lasix -Came in with creatinine of 2.10--- 1.5--1.33 -Avoid nephrotoxins  3. Essential hypertension - we will continue her Coreg -Hold losartan due to elevated creatinine Blood pressure currently elevated due to pain will use IV when necessary hydralazine  4. Hypothyroidism continue Synthroid  5. Hyperlipidemia unspecified continue Crestor  Physical therapy, Child psychotherapistsocial worker for Discharge planning   Case discussed with Care Management/Social Worker. Management plans discussed with the patient, family and they are in agreement.  CODE STATUS: Full  DVT Prophylaxis: Lovenox after surgery  TOTAL TIME TAKING CARE OF THIS PATIENT: 40  minutes.  >50% time spent on counselling and coordination of care  POSSIBLE D/C IN 1-2 DAYS, DEPENDING ON CLINICAL CONDITION.  Note: This dictation was prepared with Dragon dictation along with smaller phrase technology. Any transcriptional errors that result from this process are unintentional.  Meagan Spease M.D on 10/16/2016 at 1:03 PM  Between 7am to 6pm - Pager - [phone]  After 6pm go to [email or web address] - Social research officer, governmentpassword EPAS [facility]  [facility]  Office  [phone]  CC: Primary care physician; Orland Mustardietz, Ashley M, MD

## 2016-10-16 NOTE — Progress Notes (Signed)
   Subjective: 1 Day Post-Op Procedure(s) (LRB): INTRAMEDULLARY (IM) RETROGRADE FEMORAL NAILING (Left) Patient reports pain as mild.  Patient sleeping comfortably. Patient is well, and has had no acute complaints or problems Denies any CP, SOB, ABD pain. We will start physical therapy today.   Objective: Vital signs in last 24 hours: Temp:  [97.6 F (36.4 C)-99 F (37.2 C)] 98.7 F (37.1 C) (05/12 0437) Pulse Rate:  [56-79] 56 (05/12 0437) Resp:  [10-18] 14 (05/12 0437) BP: (82-211)/(43-128) 108/68 (05/12 0508) SpO2:  [95 %-100 %] 99 % (05/12 0101) Weight:  [63 kg (139 lb)] 63 kg (139 lb) (05/11 1241)  Intake/Output from previous day: 05/11 0701 - 05/12 0700 In: 2601.7 [I.V.:2201.7; IV Piggyback:400] Out: 3060 [Urine:2560; Blood:500] Intake/Output this shift: No intake/output data recorded.   Recent Labs  10/14/16 0825 10/15/16 0812 10/16/16 0344  HGB 13.0 11.2* 9.9*    Recent Labs  10/15/16 0812 10/16/16 0344  WBC 7.2 7.3  RBC 3.80 3.31*  HCT 34.1* 29.6*  PLT 178 161    Recent Labs  10/15/16 0721 10/16/16 0344  NA 139 134*  K 5.2* 3.9  CL 111 103  CO2 21* 25  BUN 11 12  CREATININE 1.55* 1.33*  GLUCOSE 98 200*  CALCIUM 8.6* 8.0*    Recent Labs  10/14/16 2024  INR 1.13    EXAM General - Patient is Alert, Appropriate and Oriented Extremity - Neurovascular intact Sensation intact distally Intact pulses distally Dorsiflexion/Plantar flexion intact No cellulitis present Compartment soft  Knee immobilizer and polar care unit intact. Dressing - dressing C/D/I and scant drainage  Motor Function - intact, moving foot and toes well on exam. Patient able to straight leg raise.  Past Medical History:  Diagnosis Date  . Anemia   . Arthritis   . Diastolic CHF (HCC)   . Difficult intubation   . GERD (gastroesophageal reflux disease)   . Gout   . H/O ETOH abuse   . Hyperlipemia   . Hypertension   . Hypothyroidism   . Liver cirrhosis (HCC)    . OA (osteoarthritis)     Assessment/Plan:   1 Day Post-Op Procedure(s) (LRB): INTRAMEDULLARY (IM) RETROGRADE FEMORAL NAILING (Left) Active Problems:   Hip fracture (HCC)   Acute post op blood loss anemia with underlying anemia of chronic disease   Chronic Kidney disease  Estimated body mass index is 22.44 kg/m as calculated from the following:   Height as of this encounter: 5\' 6"  (1.676 m).   Weight as of this encounter: 63 kg (139 lb). Advance diet Up with therapy, partial weight-bearing left lower extremity Needs bowel movement Dressing change tomorrow morning Acute postop blood loss anemia with hx of underlying anemia of chronic disease, Hemoglobin 9.9, continue with iron supplementation and recheck hemoglobin in am Care management to assist with discharge   DVT Prophylaxis - Lovenox and Foot Pumps Partial Weight-Bearing left lower extremity   T. Cranston Neighborhris Kendal Raffo, PA-C [facility] 10/16/2016, 8:30 AM

## 2016-10-16 NOTE — Progress Notes (Signed)
POD 1. Pt. Alert and oriented. VSS. Pain controlled with PO pain meds. Pt. Dangled at the bedside and did good. Neurochedks WDL. Foley removed at 0530. Pt. Resting quietly at this time. Will continue to monitor.

## 2016-10-16 NOTE — [place] FL2 (Signed)
[place] MEDICAID FL2 LEVEL OF CARE SCREENING TOOL     IDENTIFICATION  Patient Name: Sarah Lowery Birthdate: 08/10/1945 Sex: female Admission Date (Current Location): 10/14/2016  [place]   Facility and Address:  [facility], [address], [place], [place] [address]      Provider Number: [id number]  Attending Physician Name and Address:  Enedina FinnerPatel, Sona, MD  Relative Name and Phone Number:       Current Level of Care: Hospital Recommended Level of Care: Skilled Nursing Facility Prior Approval Number:    Date Approved/Denied:   PASRR Number: [id number]  Discharge Plan: SNF    Current Diagnoses: Patient Active Problem List   Diagnosis Date Noted  . Hip fracture (HCC) 10/14/2016    Orientation RESPIRATION BLADDER Height & Weight     Self, Time, Situation, Place  Normal Continent Weight: 139 lb (63 kg) Height:  5\' 6"  (167.6 cm)  BEHAVIORAL SYMPTOMS/MOOD NEUROLOGICAL BOWEL NUTRITION STATUS      Continent Diet (Heart healthy)  AMBULATORY STATUS COMMUNICATION OF NEEDS Skin   Extensive Assist Verbally Surgical wounds                       Personal Care Assistance Level of Assistance  Bathing, Feeding, Dressing Bathing Assistance: Limited assistance Feeding assistance: Independent Dressing Assistance: Limited assistance     Functional Limitations Info             SPECIAL CARE FACTORS FREQUENCY  PT (By licensed PT)     PT Frequency: Up to 5X per day, 5 days per week              Contractures Contractures Info: Present    Additional Factors Info  Allergies   Allergies Info: Hydrochlorothiazide, Ace Inhibitors, Isosorbide Nitrate, Nsaids           Current Medications (10/16/2016):  This is the current hospital active medication list Current Facility-Administered Medications  Medication Dose Route Frequency Provider Last Rate Last Dose  . 0.9 %  sodium chloride infusion   Intravenous  Continuous Enedina FinnerPatel, Sona, MD 50 mL/hr at 10/16/16 1019    . acetaminophen (OFIRMEV) IV 1,000 mg  1,000 mg Intravenous Q6H Hooten, Illene LabradorJames P, MD   Stopped at 10/16/16 0915  . acetaminophen (TYLENOL) tablet 650 mg  650 mg Oral Q6H PRN Auburn BilberryPatel, Shreyang, MD       Or  . acetaminophen (TYLENOL) suppository 650 mg  650 mg Rectal Q6H PRN Auburn BilberryPatel, Shreyang, MD      . acetaminophen (TYLENOL) tablet 650 mg  650 mg Oral Q6H PRN Hooten, Illene LabradorJames P, MD       Or  . acetaminophen (TYLENOL) suppository 650 mg  650 mg Rectal Q6H PRN Hooten, Illene LabradorJames P, MD      . allopurinol (ZYLOPRIM) tablet 100 mg  100 mg Oral Daily Auburn BilberryPatel, Shreyang, MD   100 mg at 10/16/16 1022  . bisacodyl (DULCOLAX) suppository 10 mg  10 mg Rectal Daily PRN Hooten, Illene LabradorJames P, MD      . carvedilol (COREG) tablet 6.25 mg  6.25 mg Oral BID WC Auburn BilberryPatel, Shreyang, MD   6.25 mg at 10/16/16 1022  . ceFAZolin (ANCEF) 2 g in dextrose 5 % 100 mL IVPB  2 g Intravenous Q6H Hooten, Illene LabradorJames P, MD   Stopped at 10/16/16 0602  . enoxaparin (LOVENOX) injection 40 mg  40 mg Subcutaneous Q24H Hooten, Illene LabradorJames P, MD   40 mg at 10/16/16 0846  .  ferrous sulfate tablet 325 mg  325 mg Oral BID WC Hooten, Illene Labrador, MD   325 mg at 10/16/16 0845  . folic acid (FOLVITE) tablet 1 mg  1 mg Oral Daily Auburn Bilberry, MD   1 mg at 10/16/16 0844  . gabapentin (NEURONTIN) capsule 900 mg  900 mg Oral TID Auburn Bilberry, MD   900 mg at 10/16/16 0846  . hydrALAZINE (APRESOLINE) injection 10 mg  10 mg Intravenous Q6H PRN Auburn Bilberry, MD   10 mg at 10/14/16 1118  . hydrOXYzine (ATARAX/VISTARIL) tablet 25 mg  25 mg Oral BID Auburn Bilberry, MD   25 mg at 10/16/16 0845  . levothyroxine (SYNTHROID, LEVOTHROID) tablet 100 mcg  100 mcg Oral QAC breakfast Auburn Bilberry, MD   100 mcg at 10/16/16 0845  . magnesium hydroxide (MILK OF MAGNESIA) suspension 30 mL  30 mL Oral Daily PRN Hooten, Illene Labrador, MD      . menthol-cetylpyridinium (CEPACOL) lozenge 3 mg  1 lozenge Oral PRN Hooten, Illene Labrador, MD       Or  .  phenol (CHLORASEPTIC) mouth spray 1 spray  1 spray Mouth/Throat PRN Hooten, Illene Labrador, MD      . metoCLOPramide (REGLAN) tablet 5-10 mg  5-10 mg Oral Q8H PRN Hooten, Illene Labrador, MD       Or  . metoCLOPramide (REGLAN) injection 5-10 mg  5-10 mg Intravenous Q8H PRN Hooten, Illene Labrador, MD      . metoCLOPramide (REGLAN) tablet 10 mg  10 mg Oral TID AC & HS Hooten, Illene Labrador, MD   10 mg at 10/16/16 0845  . morphine 2 MG/ML injection 2-4 mg  2-4 mg Intravenous Q4H PRN Hooten, Illene Labrador, MD      . ondansetron (ZOFRAN) tablet 4 mg  4 mg Oral Q6H PRN Hooten, Illene Labrador, MD       Or  . ondansetron (ZOFRAN) injection 4 mg  4 mg Intravenous Q6H PRN Hooten, Illene Labrador, MD      . oxyCODONE (Oxy IR/ROXICODONE) immediate release tablet 5-10 mg  5-10 mg Oral Q4H PRN Hooten, Illene Labrador, MD   10 mg at 10/16/16 1015  . pantoprazole (PROTONIX) EC tablet 40 mg  40 mg Oral Daily Auburn Bilberry, MD   40 mg at 10/16/16 0845  . rosuvastatin (CRESTOR) tablet 40 mg  40 mg Oral Daily Auburn Bilberry, MD   40 mg at 10/16/16 0844  . senna-docusate (Senokot-S) tablet 1 tablet  1 tablet Oral BID Hooten, Illene Labrador, MD   1 tablet at 10/16/16 0845  . sodium phosphate (FLEET) 7-19 GM/118ML enema 1 enema  1 enema Rectal Once PRN Hooten, Illene Labrador, MD      . thiamine (VITAMIN B-1) tablet 100 mg  100 mg Oral Daily Auburn Bilberry, MD   100 mg at 10/16/16 0845  . traZODone (DESYREL) tablet 100 mg  100 mg Oral QHS Auburn Bilberry, MD   100 mg at 10/15/16 2009     Discharge Medications: Please see discharge summary for a list of discharge medications.  Relevant Imaging Results:  Relevant Lab Results:   Additional Information SS# [id number]  Judi Cong, LCSW

## 2016-10-16 NOTE — Care Management Note (Signed)
Case Management Note  Patient Details  Name: Sarah Lowery MRN: [id number] Date of Birth: 05/02/1946  Subjective/Objective:      Mrs Sarah Lowery declined offer of SNF to CSW today. Not wanting to discuss discharge planning at this time. Will approach Mrs Sarah Lowery later about home health services and home equipment needs.               Action/Plan:   Expected Discharge Date:                  Expected Discharge Plan:     In-House Referral:     Discharge planning Services     Post Acute Care Choice:    Choice offered to:     DME Arranged:    DME Agency:     HH Arranged:    HH Agency:     Status of Service:     If discussed at [employer] Length of Stay Meetings, dates discussed:    Additional Comments:  Lavoris Sparling A, RN 10/16/2016, 2:53 PM

## 2016-10-16 NOTE — Progress Notes (Signed)
Foley d./c'd at 0530 

## 2016-10-16 NOTE — Progress Notes (Signed)
Physical Therapy Treatment Patient Details Name: Sarah Lowery [place] MRN: [id number] DOB: 09/16/1945 Today's Date: 10/16/2016    History of Present Illness Sarah Lowery [place]  is a 71 y.o. female POD #1 L IM nailing.     PT Comments    Pt agreeable to ambulation this afternoon.  Pt able to increase distance to 15' using RW and CGA.  Pt with forward flexed posture and able to use UE's on RW to maintain PWB'ing, but needs cues for sequencing.  Pt motivated to perform movements on her own, asking for help only when not able to complete herself.   Cont to progress POC.   Follow Up Recommendations  SNF     Equipment Recommendations  Other (comment) (defer to post acute)    Recommendations for Other Services OT consult     Precautions / Restrictions Precautions Precautions: Fall Required Braces or Orthoses: Knee Immobilizer - Left Restrictions Weight Bearing Restrictions: Yes LLE Weight Bearing: Partial weight bearing    Mobility  Bed Mobility Overal bed mobility: Needs Assistance Bed Mobility: Supine to Sit;Sit to Supine     Supine to sit: Min guard Sit to supine: Min assist   General bed mobility comments: Min A to lift LE onto bed.  Transfers Overall transfer level: Needs assistance Equipment used: Rolling walker (2 wheeled) Transfers: Sit to/from Stand Sit to Stand: Min guard         General transfer comment: slow to rise needs verbal instruction for hand/LE placement  Ambulation/Gait Ambulation/Gait assistance: Min guard Ambulation Distance (Feet): 15 Feet Assistive device: Rolling walker (2 wheeled) Gait Pattern/deviations: Step-to pattern     General Gait Details: Slow gait with verbal cues for sequencing, fatigue towards end of gait session; very slow.   Stairs            Wheelchair Mobility    Modified Rankin (Stroke Patients Only)       Balance Overall balance assessment: History of Falls                                           Cognition Arousal/Alertness: Awake/alert Behavior During Therapy: WFL for tasks assessed/performed Overall Cognitive Status: Within Functional Limits for tasks assessed                                        Exercises Other Exercises Other Exercises: Bed<>BSC with CGA; independnet with wiping with CGA for standing balance    General Comments General comments (skin integrity, edema, etc.): KI donned      Pertinent Vitals/Pain Pain Assessment:  (no specific c/o) Pain Score: 9  Pain Location: LLE Pain Descriptors / Indicators: Aching Pain Intervention(s): Monitored during session    Home Living Family/patient expects to be discharged to:: Private residence Living Arrangements: Other (Comment) (granddaughter lives with her) Available Help at Discharge: Family;Available PRN/intermittently (granddaugher works during the day) Type of Home: Apartment Home Access: Level entry   Home Layout: One level Home Equipment: Environmental consultantWalker - 4 wheels;Wheelchair - manual;Cane - single point;Shower seat      Prior Function Level of Independence: Independent with assistive device(s)      Comments: Pt limited at baseline by weak R LE/knee, uses RW and w/c for mobility; has life alert pendant but wasn't wearing it at the time of her fall; endorses  3 falls in past 12 months   PT Goals (current goals can now be found in the care plan section) Acute Rehab PT Goals Patient Stated Goal: go home with home health PT Goal Formulation: With patient Time For Goal Achievement: 10/23/16 Potential to Achieve Goals: Good Progress towards PT goals: Progressing toward goals    Frequency    BID      PT Plan Current plan remains appropriate    Co-evaluation              AM-PAC PT "6 Clicks" Daily Activity  Outcome Measure                   End of Session Equipment Utilized During Treatment: Gait belt Activity Tolerance: Patient tolerated treatment well Patient  left: in bed;with call bell/phone within reach Nurse Communication: Mobility status PT Visit Diagnosis: Unsteadiness on feet (R26.81);History of falling (Z91.81);Muscle weakness (generalized) (M62.81)     Time: 4098-1191 PT Time Calculation (min) (ACUTE ONLY): 29 min  Charges:  $Gait Training: 8-22 mins $Therapeutic Activity: 8-22 mins                    G Codes:       Fatima Fedie A Khriz Liddy, PT 10/16/2016, 4:19 PM

## 2016-10-16 NOTE — Progress Notes (Signed)
Pt doing well catheter removed, urinated, pain med's given x 1, resting in bed.

## 2016-10-16 NOTE — Clinical Social Work Note (Signed)
CSW visited the patient at bedside to discuss discharge planning. The patient has declined SNF placement and is interested in pursuing home health instead. The CSW is signing off. Please consult should additional needs arise.  Argentina PonderKaren Martha Kepler Mccabe, MSW, Theresia MajorsLCSWA [phone]

## 2016-10-16 NOTE — Evaluation (Signed)
Physical Therapy Evaluation Patient Details Name: Masiel Gentzler MRN: [id number] DOB: [birth year] Today's Date: 10/16/2016   History of Present Illness  Anndrea Mihelich  is a 71 y.o. female POD #1 L IM nailing.  Clinical Impression  Pt reports her R knee gave out while getting up to the bathroom in the middle of the night and she landed on her L leg, which is her "good" leg.  Pt fearful that her R leg is going to give out on her again.  Pt able to get out of bed with Min A and ambulate 8' with RW and Min A.  Pt needs cues to maintain gait sequence and PWB'ing status.  Pt would benefit from acute PT services to address objective findings.     Follow Up Recommendations SNF    Equipment Recommendations  Other (comment) (defer to post acute)    Recommendations for Other Services OT consult     Precautions / Restrictions Precautions Precautions: Fall Required Braces or Orthoses: Knee Immobilizer - Left Restrictions Weight Bearing Restrictions: Yes LLE Weight Bearing: Partial weight bearing      Mobility  Bed Mobility Overal bed mobility: Needs Assistance Bed Mobility: Supine to Sit     Supine to sit: Min assist     General bed mobility comments: Supine>sit with Min A for L LE negotiation off bed and to elevate trunk/rotate hips  Transfers Overall transfer level: Needs assistance Equipment used: Rolling walker (2 wheeled) Transfers: Sit to/from Stand Sit to Stand: Min assist         General transfer comment: Slow to rise and increased time to transfer hands from bed to RW; Min A for balance; verbal cues for posture and to extend UE's  Ambulation/Gait Ambulation/Gait assistance: Min assist Ambulation Distance (Feet): 8 Feet Assistive device: Rolling walker (2 wheeled) Gait Pattern/deviations: Step-to pattern Gait velocity: slow   General Gait Details: Slow gait with verbal cues for sequencing, pt unable to maintain proper sequencing when PT stopped cueing; no R  LE buckling observed however pt fearful that it might and ambulates very cautiously  Stairs            Wheelchair Mobility    Modified Rankin (Stroke Patients Only)       Balance Overall balance assessment: Needs assistance Sitting-balance support: Feet unsupported Sitting balance-Leahy Scale: Good     Standing balance support: Bilateral upper extremity supported;During functional activity Standing balance-Leahy Scale: Fair Standing balance comment: assist required for balance during sit<>stand transition                             Pertinent Vitals/Pain Pain Assessment: No/denies pain    Home Living Family/patient expects to be discharged to:: Private residence Living Arrangements: Other (Comment) (grand daughter) Available Help at Discharge: Family;Available PRN/intermittently Type of Home: House Home Access: Level entry     Home Layout: One level Home Equipment: Walker - 4 wheels;Wheelchair - manual      Prior Function Level of Independence: Independent with assistive device(s)         Comments: Pt limited at baseline by weak R LE/knee, uses RW and w/c for mobility     Hand Dominance        Extremity/Trunk Assessment   Upper Extremity Assessment Upper Extremity Assessment: Overall WFL for tasks assessed    Lower Extremity Assessment Lower Extremity Assessment: Overall WFL for tasks assessed;LLE deficits/detail LLE Deficits / Details: able to break gravity with SLR LLE:  Unable to fully assess due to pain;Unable to fully assess due to immobilization    Cervical / Trunk Assessment Cervical / Trunk Assessment: Normal  Communication   Communication: No difficulties  Cognition Arousal/Alertness: Awake/alert Behavior During Therapy: WFL for tasks assessed/performed Overall Cognitive Status: Within Functional Limits for tasks assessed                                        General Comments General comments (skin  integrity, edema, etc.): KI and surgical dressing intact    Exercises Other Exercises Other Exercises: Seated AP, QS, and SLR   Assessment/Plan    PT Assessment Patient needs continued PT services  PT Problem List Decreased strength;Decreased activity tolerance;Decreased balance;Decreased mobility;Decreased knowledge of use of DME;Decreased safety awareness       PT Treatment Interventions DME instruction;Gait training;Functional mobility training;Therapeutic activities;Therapeutic exercise;Balance training;Patient/family education    PT Goals (Current goals can be found in the Care Plan section)  Acute Rehab PT Goals Patient Stated Goal: None stated PT Goal Formulation: With patient Time For Goal Achievement: 10/23/16 Potential to Achieve Goals: Good    Frequency BID   Barriers to discharge Decreased caregiver support Grand daughter works outside the home    Co-evaluation               AM-PAC PT "6 Clicks" Daily Activity  Outcome Measure                  End of Session Equipment Utilized During Treatment: Gait belt Activity Tolerance: Patient tolerated treatment well Patient left: in chair;with call bell/phone within reach Nurse Communication: Mobility status PT Visit Diagnosis: History of falling (Z91.81);Difficulty in walking, not elsewhere classified (R26.2)    Time: 1015-1050 PT Time Calculation (min) (ACUTE ONLY): 35 min   Charges:   PT Evaluation $PT Eval Moderate Complexity: 1 Procedure PT Treatments $Gait Training: 8-22 mins   PT G Codes:        Toini Failla A Trinidad Ingle, PT 10/16/2016, 11:01 AM

## 2016-10-17 LAB — BASIC METABOLIC PANEL
ANION GAP: 5 (ref 5–15)
BUN: 14 mg/dL (ref 6–20)
CHLORIDE: 106 mmol/L (ref 101–111)
CO2: 25 mmol/L (ref 22–32)
Calcium: 7.6 mg/dL — ABNORMAL LOW (ref 8.9–10.3)
Creatinine, Ser: 1.43 mg/dL — ABNORMAL HIGH (ref 0.44–1.00)
GFR calc Af Amer: 41 mL/min — ABNORMAL LOW (ref >60)
GFR, EST NON AFRICAN AMERICAN: 36 mL/min — AB (ref >60)
Glucose, Bld: 135 mg/dL — ABNORMAL HIGH (ref 65–99)
POTASSIUM: 3.6 mmol/L (ref 3.5–5.1)
SODIUM: 136 mmol/L (ref 135–145)

## 2016-10-17 LAB — CBC
HCT: 23.4 % — ABNORMAL LOW (ref 35.0–47.0)
HEMOGLOBIN: 7.7 g/dL — AB (ref 12.0–16.0)
MCH: 29.2 pg (ref 26.0–34.0)
MCHC: 32.8 g/dL (ref 32.0–36.0)
MCV: 89.1 fL (ref 80.0–100.0)
PLATELETS: 142 10*3/uL — AB (ref 150–440)
RBC: 2.62 MIL/uL — AB (ref 3.80–5.20)
RDW: 15.7 % — ABNORMAL HIGH (ref 11.5–14.5)
WBC: 8.3 10*3/uL (ref 3.6–11.0)

## 2016-10-17 MED ORDER — LOSARTAN POTASSIUM 25 MG PO TABS
25.0000 mg | ORAL_TABLET | Freq: Every day | ORAL | Status: DC
  Administered 2016-10-17 – 2016-10-18 (×2): 25 mg via ORAL
  Filled 2016-10-17 (×2): qty 1

## 2016-10-17 NOTE — Progress Notes (Signed)
[facility]   PATIENT NAME: Sarah Lowery    MR#:  [id number]  DATE OF BIRTH:  [birth year]  SUBJECTIVE:  Came in after knees gave out and fell at home POD #2 REVIEW OF SYSTEMS:   Review of Systems  Constitutional: Negative for chills, fever and weight loss.  HENT: Negative for ear discharge, ear pain and nosebleeds.   Eyes: Negative for blurred vision, pain and discharge.  Respiratory: Negative for sputum production, shortness of breath, wheezing and stridor.   Cardiovascular: Negative for chest pain, palpitations, orthopnea and PND.  Gastrointestinal: Negative for abdominal pain, diarrhea, nausea and vomiting.  Genitourinary: Negative for frequency and urgency.  Musculoskeletal: Positive for joint pain. Negative for back pain.  Neurological: Positive for weakness. Negative for sensory change, speech change and focal weakness.  Psychiatric/Behavioral: Negative for depression and hallucinations. The patient is not nervous/anxious.    Tolerating Diet:yes Tolerating PT:SNF  DRUG ALLERGIES:   Allergies  Allergen Reactions  . Hydrochlorothiazide Other (See Comments)    Causes hyponatremia if used concomitantly with furosemide  . Ace Inhibitors Other (See Comments)    cough  . Isosorbide Nitrate     headache  . Nsaids     Kidney Disease    VITALS:  Blood pressure (!) 170/59, pulse 79, temperature 98.2 F (36.8 C), temperature source Oral, resp. rate 18, height 5\' 6"  (1.676 m), weight 63 kg (139 lb), SpO2 100 %.  PHYSICAL EXAMINATION:   Physical Exam  GENERAL:  71 y.o.-year-old patient lying in the bed with no acute distress.  EYES: Pupils equal, round, reactive to light and accommodation. No scleral icterus. Extraocular muscles intact.  HEENT: Head atraumatic, normocephalic. Oropharynx and nasopharynx clear.  NECK:  Supple, no jugular venous distention. No thyroid enlargement, no tenderness.  LUNGS: Normal breath sounds  bilaterally, no wheezing, rales, rhonchi. No use of accessory muscles of respiration.  CARDIOVASCULAR: S1, S2 normal. No murmurs, rubs, or gallops.  ABDOMEN: Soft, nontender, nondistended. Bowel sounds present. No organomegaly or mass.  EXTREMITIES: No cyanosis, clubbing or edema b/l.   Left LE surgical dressing+ NEUROLOGIC: Cranial nerves II through XII are intact. No focal Motor or sensory deficits b/l.   PSYCHIATRIC:  patient is alert and oriented x 3.  SKIN: No obvious rash, lesion, or ulcer.   LABORATORY PANEL:  CBC  Recent Labs Lab 10/17/16 0324  WBC 8.3  HGB 7.7*  HCT 23.4*  PLT 142*    Chemistries   Recent Labs Lab 10/17/16 0324  NA 136  K 3.6  CL 106  CO2 25  GLUCOSE 135*  BUN 14  CREATININE 1.43*  CALCIUM 7.6*   Cardiac Enzymes No results for input(s): TROPONINI in the last 168 hours. RADIOLOGY:  Dg C-arm 61-120 Min  Result Date: 10/15/2016 CLINICAL DATA:  Left distal femur spiral fracture, operative fixation EXAM: LEFT FEMUR 2 VIEWS; DG C-ARM 61-120 MIN COMPARISON:  10/14/2016 FINDINGS: Three spot fluoroscopic intraoperative views demonstrate retrograde intramedullary rod insertion traversing the left distal femur spiral fracture with improved alignment. 4 distal fixation screws evident. One proximal fixation screw. Advanced left knee tricompartmental osteoarthritis noted, most pronounced in the medial compartment with bone-on-bone contact, deformity, sclerosis and osteophytes. IMPRESSION: Status post ORIF of the left femur distal spiral fracture with improved alignment. Advanced left knee tricompartmental osteoarthritis. Electronically Signed   By: Sarah Petit.  Lowery M.D.   On: 10/15/2016 16:59   Dg Femur Min 2 Views Left  Result Date: 10/15/2016 CLINICAL DATA:  Left distal femur spiral fracture, operative fixation EXAM: LEFT FEMUR 2 VIEWS; DG C-ARM 61-120 MIN COMPARISON:  10/14/2016 FINDINGS: Three spot fluoroscopic intraoperative views demonstrate retrograde  intramedullary rod insertion traversing the left distal femur spiral fracture with improved alignment. 4 distal fixation screws evident. One proximal fixation screw. Advanced left knee tricompartmental osteoarthritis noted, most pronounced in the medial compartment with bone-on-bone contact, deformity, sclerosis and osteophytes. IMPRESSION: Status post ORIF of the left femur distal spiral fracture with improved alignment. Advanced left knee tricompartmental osteoarthritis. Electronically Signed   By: Sarah PetitM.  Lowery M.D.   On: 10/15/2016 16:59   Dg Femur Port Min 2 Views Left  Result Date: 10/15/2016 CLINICAL DATA:  Femoral shaft fracture post femoral hardware placement. EXAM: LEFT FEMUR PORTABLE 2 VIEWS COMPARISON:  Preoperative radiographs yesterday. FINDINGS: Intramedullary nail with proximal and distal locking screws traverses displaced distal femur fracture. There is improved fracture alignment from preoperative exam with mild residual displacement. Recent postsurgical change includes the skin staples proximal and distally. Overlying brace in place about the knee. Advanced knee osteoarthritis. IMPRESSION: ORIF distal femur fracture with intramedullary nail and locking screws. Improved fracture alignment from preoperative exam. Electronically Signed   By: Sarah Lowery M.D.   On: 10/15/2016 18:34   ASSESSMENT AND PLAN:  Sarah Lowery  is a 71 y.o. female with a known history of  Osteoarthritis, history of alcohol abuse in the past, history of liver cirrhosis., Diastolic CHF, gout, hyperlipidemia, essential hypertension and hypothyroidism who states that she's been having trouble with both her knees for the past few months and and has been wearing a knee brace intermittently. Who woke up around 1 AM as well as go to the bathroom.   1. Left-sided femoral fracture status post mechanical fall Patient at moderate risk of surgical complications. - No further cardiopulmonary workup needed. -POD#2 Open  reduction and internal fixation of a left distal femoral shaft fracture by Dr Sarah Lowery  2. Acute renal failure suspect due to dehydration and diuresis -recieved IV hydration. Stop her Lasix -Came in with creatinine of 2.10--- 1.5--1.33 -Avoid nephrotoxins  3. Essential hypertension - we will continue her Coreg -Held losartan due to elevated creatinine---resume now Blood pressure currently elevated due to pain will use IV when necessary hydralazine  4. Hypothyroidism continue Synthroid  5. Hyperlipidemia unspecified continue Crestor  Physical therapy, Child psychotherapistsocial worker for Discharge planning   Case discussed with Care Management/Social Worker. Management plans discussed with the patient, family and they are in agreement.  CODE STATUS: Full  DVT Prophylaxis: Lovenox after surgery  TOTAL TIME TAKING CARE OF THIS PATIENT: 40  minutes.  >50% time spent on counselling and coordination of care  POSSIBLE D/C IN 1-2 DAYS, DEPENDING ON CLINICAL CONDITION.  Note: This dictation was prepared with Dragon dictation along with smaller phrase technology. Any transcriptional errors that result from this process are unintentional.  Chrystal Zeimet M.D on 10/17/2016 at 11:03 AM  Between 7am to 6pm - Pager - [phone]  After 6pm go to [email or web address] - Social research officer, governmentpassword [email or web address] [email or web address]  [facility]  Office  [phone]  CC: Primary care physician; Orland Mustardietz, Ashley M, MD

## 2016-10-17 NOTE — Clinical Social Work Note (Signed)
Clinical Social Work Assessment  Patient Details  Name: Sarah Lowery MRN: [id number] Date of Birth: 03/29/1946  Date of referral:  10/17/16               Reason for consult:  Facility Placement                Permission sought to share information with:  Facility Contact Representative Permission granted to share information::  Yes, Verbal Permission Granted  Name::        Agency::     Relationship::     Contact Information:     Housing/Transportation Living arrangements for the past 2 months:  Skilled Nursing Facility Source of Information:  Patient Patient Interpreter Needed:  None Criminal Activity/Legal Involvement Pertinent to Current Situation/Hospitalization:  No - Comment as needed Significant Relationships:  Adult Children Lives with:  Self Do you feel safe going back to the place where you live?  Yes Need for family participation in patient care:  No (Coment)  Care giving concerns:  PT recommendation for STR   Social Worker assessment / plan:  CSW met with the patient at bedside to discuss discharge planning. The patient initially declined SNF; however, she changed her mind and indicated that she would prefer [facility] for discharge is possible.  CSW sent referral and FL2. Patient will most likely discharge on 5/14.   Employment status:  Retired Insurance information:  Managed Medicare PT Recommendations:  Skilled Nursing Facility Information / Referral to community resources:  Skilled Nursing Facility  Patient/Family's Response to care:  The patient thanked the CSW for assistance.  Patient/Family's Understanding of and Emotional Response to Diagnosis, Current Treatment, and Prognosis:  The patient understands the need for SNF level of care; the patient is now in agreement.  Emotional Assessment Appearance:  Appears stated age Attitude/Demeanor/Rapport:   (Appropriate) Affect (typically observed):  Accepting, Appropriate, Pleasant Orientation:  Oriented to Self,  Oriented to Place, Oriented to  Time, Oriented to Situation Alcohol / Substance use:  Never Used Psych involvement (Current and /or in the community):  No (Comment)  Discharge Needs  Concerns to be addressed:  Care Coordination, Discharge Planning Concerns Readmission within the last 30 days:  No Current discharge risk:  None Barriers to Discharge:  Continued Medical Work up   Karen M White, LCSW 10/17/2016, 9:49 AM  

## 2016-10-17 NOTE — Progress Notes (Signed)
Physical Therapy Treatment Patient Details Name: Sarah Lowery MRN: [id number] DOB: 02/10/1946 Today's Date: 10/17/2016    History of Present Illness Sarah Lowery  is a 71 y.o. female POD #2 L IM nailing for midshaft femur fx.     PT Comments    Patient is quite limited with overall mobility secondary to advanced degeneration/pain/weakness in RLE and fx and limited ability to bear weight through LLE. It takes nearly 1 minute to stand with significant assistance from her UEs and PT. Her steps are very slow and short, and she is really only able to turn while upright in this session. She is demonstrating improving ability to stand without buckling, though will need extended bout of rehabilitation to safely ambulate independently.   Follow Up Recommendations  SNF     Equipment Recommendations       Recommendations for Other Services OT consult     Precautions / Restrictions Precautions Precautions: Fall Required Braces or Orthoses: Knee Immobilizer - Left Restrictions Weight Bearing Restrictions: Yes LLE Weight Bearing: Partial weight bearing    Mobility  Bed Mobility Overal bed mobility: Needs Assistance Bed Mobility: Sit to Supine       Sit to supine: Min assist;Mod assist   General bed mobility comments: Patient requires assistance bring LEs onto bed surface due to weakness/pain.   Transfers Overall transfer level: Needs assistance Equipment used: Rolling walker (2 wheeled) Transfers: Sit to/from Stand Sit to Stand: Mod assist         General transfer comment: Patient requires prolonged period of time and support from therapist to complete sit to stand transfer. Near complete reliance on UEs.   Ambulation/Gait Ambulation/Gait assistance: Min assist;Mod assist Ambulation Distance (Feet): 5 Feet Assistive device: Rolling walker (2 wheeled) Gait Pattern/deviations: Step-to pattern   Gait velocity interpretation: Below normal speed for age/gender General  Gait Details: Patient is unable to take any forward steps, just turning to get onto bedside commode secondary to pain/weakness in bilateral LEs.    Stairs            Wheelchair Mobility    Modified Rankin (Stroke Patients Only)       Balance Overall balance assessment: History of Falls;Needs assistance Sitting-balance support: Feet unsupported Sitting balance-Leahy Scale: Good     Standing balance support: Bilateral upper extremity supported;During functional activity Standing balance-Leahy Scale: Poor                              Cognition Arousal/Alertness: Awake/alert Behavior During Therapy: WFL for tasks assessed/performed Overall Cognitive Status: Within Functional Limits for tasks assessed                                        Exercises General Exercises - Lower Extremity Ankle Circles/Pumps: AROM;Both;10 reps Long Arc Quad: AROM;Right;10 reps Heel Slides: AROM;Right;10 reps Straight Leg Raises: AROM;AAROM;Both;10 reps Other Exercises Other Exercises: Assisted patient onto bedside commode with min-mod A for transfers, cuing, hand placement during transfer(s).     General Comments General comments (skin integrity, edema, etc.): KI donned       Pertinent Vitals/Pain Pain Assessment: Faces Faces Pain Scale: Hurts even more Pain Location: L thigh  Pain Descriptors / Indicators: Aching Pain Intervention(s): Limited activity within patient's tolerance;Monitored during session;Repositioned;Ice applied    Home Living  Prior Function            PT Goals (current goals can now be found in the care plan section) Acute Rehab PT Goals Patient Stated Goal: go home with home health PT Goal Formulation: With patient Time For Goal Achievement: 10/23/16 Potential to Achieve Goals: Fair Progress towards PT goals: Progressing toward goals    Frequency    BID      PT Plan Current plan remains  appropriate    Co-evaluation              AM-PAC PT "6 Clicks" Daily Activity  Outcome Measure  Difficulty turning over in bed (including adjusting bedclothes, sheets and blankets)?: A Lot Difficulty moving from lying on back to sitting on the side of the bed? : A Little Difficulty sitting down on and standing up from a chair with arms (e.g., wheelchair, bedside commode, etc,.)?: A Lot Help needed moving to and from a bed to chair (including a wheelchair)?: A Lot Help needed walking in hospital room?: Total Help needed climbing 3-5 steps with a railing? : Total 6 Click Score: 11    End of Session Equipment Utilized During Treatment: Gait belt Activity Tolerance: Patient tolerated treatment well;Patient limited by fatigue;Patient limited by pain Patient left: in bed;with call bell/phone within reach;with bed alarm set Nurse Communication: Mobility status PT Visit Diagnosis: Unsteadiness on feet (R26.81);History of falling (Z91.81);Muscle weakness (generalized) (M62.81)     Time: 1610-9604 PT Time Calculation (min) (ACUTE ONLY): 27 min  Charges:  $Gait Training: 8-22 mins $Therapeutic Activity: 8-22 mins                    G Codes:       Alva Garnet PT, DPT, CSCS    10/17/2016, 3:25 PM

## 2016-10-17 NOTE — Progress Notes (Signed)
   Subjective: 2 Days Post-Op Procedure(s) (LRB): INTRAMEDULLARY (IM) RETROGRADE FEMORAL NAILING (Left) Patient reports pain as mild.  Patient sleeping comfortably. Patient is well, and has had no acute complaints or problems.  Denies any CP, SOB, ABD pain. We will resume physical therapy today.   Objective: Vital signs in last 24 hours: Temp:  [97.8 F (36.6 C)-98.2 F (36.8 C)] 98.2 F (36.8 C) (05/13 0744) Pulse Rate:  [64-79] 79 (05/13 0744) Resp:  [18] 18 (05/13 0744) BP: (100-191)/(59-169) 170/59 (05/13 0744) SpO2:  [98 %-100 %] 100 % (05/13 0744)  Intake/Output from previous day: 05/12 0701 - 05/13 0700 In: 1742.5 [P.O.:240; I.V.:1302.5; IV Piggyback:200] Out: 277 [Urine:277] Intake/Output this shift: No intake/output data recorded.   Recent Labs  10/15/16 0812 10/16/16 0344 10/17/16 0324  HGB 11.2* 9.9* 7.7*    Recent Labs  10/16/16 0344 10/17/16 0324  WBC 7.3 8.3  RBC 3.31* 2.62*  HCT 29.6* 23.4*  PLT 161 142*    Recent Labs  10/16/16 0344 10/17/16 0324  NA 134* 136  K 3.9 3.6  CL 103 106  CO2 25 25  BUN 12 14  CREATININE 1.33* 1.43*  GLUCOSE 200* 135*  CALCIUM 8.0* 7.6*    Recent Labs  10/14/16 2024  INR 1.13    EXAM General - Patient is Alert, Appropriate and Oriented Extremity - Neurovascular intact Sensation intact distally Intact pulses distally Dorsiflexion/Plantar flexion intact No cellulitis present Compartment soft  Knee immobilizer and polar care unit intact. Dressing - dressing C/D/I and scant drainage, dressings changed. No active drainage or hematoma present. Thigh is soft. Motor Function - intact, moving foot and toes well on exam. Patient able to straight leg raise.  Past Medical History:  Diagnosis Date  . Anemia   . Arthritis   . Diastolic CHF (HCC)   . Difficult intubation   . GERD (gastroesophageal reflux disease)   . Gout   . H/O ETOH abuse   . Hyperlipemia   . Hypertension   . Hypothyroidism   .  Liver cirrhosis (HCC)   . OA (osteoarthritis)     Assessment/Plan:   2 Days Post-Op Procedure(s) (LRB): INTRAMEDULLARY (IM) RETROGRADE FEMORAL NAILING (Left) Active Problems:   Hip fracture (HCC)   Acute post op blood loss anemia with underlying anemia of chronic disease   Chronic Kidney disease  Estimated body mass index is 22.44 kg/m as calculated from the following:   Height as of this encounter: 5\' 6"  (1.676 m).   Weight as of this encounter: 63 kg (139 lb). Advance diet Up with therapy, partial weight-bearing left lower extremity Acute postop blood loss anemia with hx of underlying anemia of chronic disease, Hemoglobin 9.9, continue with iron supplementation and recheck hemoglobin in am Care management to assist with discharge, patient agreeable to SNF. Plan on discharge to SNF tomorrow.   DVT Prophylaxis - Lovenox and Foot Pumps Partial Weight-Bearing left lower extremity   T. Cranston Neighborhris Sheyann Sulton, PA-C [facility] 10/17/2016, 9:48 AM

## 2016-10-17 NOTE — Clinical Social Work Note (Signed)
CSW received verbal update from the patient's orthopedic surgeon that the patient's daughter is wanting to have the patient discharged to her personal care in [place]. At this time, the patient reports that she wants to go to STR prior to going to [place]. CSW plan remains unchanged as the patient is capable of making her own medical decisions.  Sarah PonderKaren Martha Menucha Dicesare, MSW, Theresia MajorsLCSWA [phone]

## 2016-10-17 NOTE — Clinical Social Work Placement (Signed)
   CLINICAL SOCIAL WORK PLACEMENT  NOTE  Date:  10/17/2016  Patient Details  Name: Sarah Lowery MRN: [id number] Date of Birth: 12/10/1945  Clinical Social Work is seeking post-discharge placement for this patient at the Skilled  Nursing Facility level of care (*CSW will initial, date and re-position this form in  chart as items are completed):  Yes   Patient/family provided with [facility] Clinical Social Work Department's list of facilities offering this level of care within the geographic area requested by the patient (or if unable, by the patient's family).  Yes   Patient/family informed of their freedom to choose among providers that offer the needed level of care, that participate in Medicare, Medicaid or managed care program needed by the patient, have an available bed and are willing to accept the patient.  Yes   Patient/family informed of [facility]'s ownership interest in [facility] and [facility], as well as of the fact that they are under no obligation to receive care at these facilities.  PASRR submitted to EDS on       PASRR number received on       Existing PASRR number confirmed on 10/17/16     FL2 transmitted to all facilities in geographic area requested by pt/family on 10/17/16     FL2 transmitted to all facilities within larger geographic area on       Patient informed that his/her managed care company has contracts with or will negotiate with certain facilities, including the following:            Patient/family informed of bed offers received.  Patient chooses bed at       Physician recommends and patient chooses bed at      Patient to be transferred to   on  .  Patient to be transferred to facility by       Patient family notified on   of transfer.  Name of family member notified:        PHYSICIAN       Additional Comment:    _______________________________________________ Judi CongKaren M Mae Denunzio, LCSW 10/17/2016, 9:56 AM

## 2016-10-18 ENCOUNTER — Encounter: Payer: Self-pay | Admitting: Orthopedic Surgery

## 2016-10-18 LAB — CBC
HCT: 28.4 % — ABNORMAL LOW (ref 35.0–47.0)
Hemoglobin: 9.2 g/dL — ABNORMAL LOW (ref 12.0–16.0)
MCH: 28.9 pg (ref 26.0–34.0)
MCHC: 32.2 g/dL (ref 32.0–36.0)
MCV: 89.5 fL (ref 80.0–100.0)
Platelets: 180 10*3/uL (ref 150–440)
RBC: 3.18 MIL/uL — ABNORMAL LOW (ref 3.80–5.20)
RDW: 16 % — AB (ref 11.5–14.5)
WBC: 10.8 10*3/uL (ref 3.6–11.0)

## 2016-10-18 MED ORDER — FUROSEMIDE 40 MG PO TABS: 80.0000 mg | ORAL_TABLET | Freq: Every day | ORAL | 0 refills | Status: AC

## 2016-10-18 MED ORDER — GABAPENTIN 300 MG PO CAPS: 600.0000 mg | ORAL_CAPSULE | Freq: Two times a day (BID) | ORAL | 1 refills | Status: AC

## 2016-10-18 MED ORDER — FERROUS SULFATE 325 (65 FE) MG PO TABS: 325.0000 mg | ORAL_TABLET | Freq: Two times a day (BID) | ORAL | 3 refills | Status: AC

## 2016-10-18 MED ORDER — ENOXAPARIN SODIUM 40 MG/0.4ML ~~LOC~~ SOLN: 40.0000 mg | SUBCUTANEOUS | 0 refills | Status: AC

## 2016-10-18 MED ORDER — GABAPENTIN 600 MG PO TABS: 600.0000 mg | ORAL_TABLET | Freq: Two times a day (BID) | ORAL | Status: DC

## 2016-10-18 NOTE — Anesthesia Postprocedure Evaluation (Signed)
Anesthesia Post Note  Patient: Sarah Lowery  Procedure(s) Performed: Procedure(s) (LRB): INTRAMEDULLARY (IM) RETROGRADE FEMORAL NAILING (Left)  Patient location during evaluation: PACU Anesthesia Type: General Level of consciousness: awake and alert Pain management: pain level controlled Vital Signs Assessment: post-procedure vital signs reviewed and stable Respiratory status: spontaneous breathing, nonlabored ventilation, respiratory function stable and patient connected to nasal cannula oxygen Cardiovascular status: blood pressure returned to baseline and stable Postop Assessment: no signs of nausea or vomiting Anesthetic complications: no     Last Vitals:  Vitals:   10/18/16 0736 10/18/16 1050  BP: (!) 172/54   Pulse: 76 75  Resp: 18   Temp: 36.8 C     Last Pain:  Vitals:   10/18/16 0943  TempSrc:   PainSc: 8                  Brianah Hopson S

## 2016-10-18 NOTE — Progress Notes (Signed)
Patient is alert and oriented and able to verbalize needs. No complaints of pain at this time. PIV removed. VS stable. [facility] called for transport. Patient ready for transport, belongings packed, will continue to monitor.   Suzan SlickAlison L Dalaina Tates, RN

## 2016-10-18 NOTE — Discharge Summary (Addendum)
[facility] - [place] [facility]   PATIENT NAME: Sarah Lowery    MR#:  [id number]  DATE OF BIRTH:  [birth year]  DATE OF ADMISSION:  10/14/2016 ADMITTING PHYSICIAN: Auburn Bilberry, MD  DATE OF DISCHARGE: 10/18/16  PRIMARY CARE PHYSICIAN: Orland Mustard, MD    ADMISSION DIAGNOSIS:  CHF (congestive heart failure) (HCC) [I50.9] Acute kidney injury (HCC) [N17.9] Closed fracture of left femur, unspecified fracture morphology, unspecified portion of femur, initial encounter (HCC) [S72.92XA]  DISCHARGE DIAGNOSIS:  Closed fracture left femur s/p ORIF POD#3  SECONDARY DIAGNOSIS:   Past Medical History:  Diagnosis Date  . Anemia   . Arthritis   . Diastolic CHF (HCC)   . Difficult intubation   . GERD (gastroesophageal reflux disease)   . Gout   . H/O ETOH abuse   . Hyperlipemia   . Hypertension   . Hypothyroidism   . Liver cirrhosis (HCC)   . OA (osteoarthritis)     HOSPITAL COURSE:  RosalindWilliamsis a 71 y.o.femalewith a known history of Osteoarthritis, history of alcohol abuse in the past, history of liver cirrhosis., Diastolic CHF, gout, hyperlipidemia, essential hypertension and hypothyroidism who states that she's been having trouble with both her knees for the past few months and and has been wearing a knee brace intermittently. Who woke up around 1 AM as well as go to the bathroom.   1. Left-sided femoral fracture status post mechanical fall Patient at moderate risk of surgical complications. - No further cardiopulmonary workup needed. -POD# 3 Open reduction and internal fixation of a left distal femoral shaft fracture by Dr Ernest Pine  2. Acute renal failure suspect due to dehydration and diuresis -recieved IV hydration. Held her Lasix---now dose decreased to 40 mg daily (was on high dose of 80 mg) -Came in with creatinine of 2.10--- 1.5--1.33 -Avoid nephrotoxins  3. Essential hypertension - we will continue her Coreg -Held  losartan due to elevated creatinine---resumed now  4. Hypothyroidism continue Synthroid  5. Hyperlipidemia unspecified continue Crestor  Physical therapy recommends rehab Pt agreeable. D/c to rehab today  CONSULTS OBTAINED:  Treatment Team:  Donato Heinz, MD  DRUG ALLERGIES:   Allergies  Allergen Reactions  . Hydrochlorothiazide Other (See Comments)    Causes hyponatremia if used concomitantly with furosemide  . Ace Inhibitors Other (See Comments)    cough  . Isosorbide Nitrate     headache  . Nsaids     Kidney Disease    DISCHARGE MEDICATIONS:   Current Discharge Medication List    START taking these medications   Details  enoxaparin (LOVENOX) 40 MG/0.4ML injection Inject 0.4 mLs (40 mg total) into the skin daily. Qty: 14 Syringe, Refills: 0    ferrous sulfate 325 (65 FE) MG tablet Take 1 tablet (325 mg total) by mouth 2 (two) times daily with a meal. Qty: 60 tablet, Refills: 3      CONTINUE these medications which have CHANGED   Details  furosemide (LASIX) 40 MG tablet Take 2 tablets (80 mg total) by mouth daily. Qty: 30 tablet, Refills: 0    gabapentin (NEURONTIN) 300 MG capsule Take 2 capsules (600 mg total) by mouth 2 (two) times daily. Qty: 60 capsule, Refills: 1      CONTINUE these medications which have NOT CHANGED   Details  allopurinol (ZYLOPRIM) 100 MG tablet Take 100 mg by mouth daily.    carvedilol (COREG) 6.25 MG tablet Take 6.25 mg by mouth 2 (two) times daily with a meal.  folic acid (FOLVITE) 1 MG tablet Take 1 mg by mouth daily.    hydrOXYzine (VISTARIL) 25 MG capsule Take 25 mg by mouth 2 (two) times daily.    levothyroxine (SYNTHROID, LEVOTHROID) 100 MCG tablet Take 100 mcg by mouth daily before breakfast.    losartan (COZAAR) 25 MG tablet Take 12.5 mg by mouth daily.    omeprazole (PRILOSEC) 20 MG capsule Take 20 mg by mouth daily.    rosuvastatin (CRESTOR) 40 MG tablet Take 40 mg by mouth daily.    thiamine (VITAMIN  B-1) 100 MG tablet Take 100 mg by mouth daily.    traZODone (DESYREL) 50 MG tablet Take 100 mg by mouth at bedtime.    oxycodone (OXY-IR) 5 MG capsule Take 5 mg by mouth every 8 (eight) hours as needed.        If you experience worsening of your admission symptoms, develop shortness of breath, life threatening emergency, suicidal or homicidal thoughts you must seek medical attention immediately by calling 911 or calling your MD immediately  if symptoms less severe.  You Must read complete instructions/literature along with all the possible adverse reactions/side effects for all the Medicines you take and that have been prescribed to you. Take any new Medicines after you have completely understood and accept all the possible adverse reactions/side effects.   Please note  You were cared for by a hospitalist during your hospital stay. If you have any questions about your discharge medications or the care you received while you were in the hospital after you are discharged, you can call the unit and asked to speak with the hospitalist on call if the hospitalist that took care of you is not available. Once you are discharged, your primary care physician will handle any further medical issues. Please note that NO REFILLS for any discharge medications will be authorized once you are discharged, as it is imperative that you return to your primary care physician (or establish a relationship with a primary care physician if you do not have one) for your aftercare needs so that they can reassess your need for medications and monitor your lab values. Today   SUBJECTIVE   Doing well  VITAL SIGNS:  Blood pressure (!) 172/54, pulse 75, temperature 98.2 F (36.8 C), temperature source Oral, resp. rate 18, height 5\' 6"  (1.676 m), weight 63 kg (139 lb), SpO2 96 %.  I/O:    Intake/Output Summary (Last 24 hours) at 10/18/16 1200 Last data filed at 10/18/16 0842  Gross per 24 hour  Intake          1345.83  ml  Output              400 ml  Net           945.83 ml    PHYSICAL EXAMINATION:  GENERAL:  71 y.o.-year-old patient lying in the bed with no acute distress. Appears weak EYES: Pupils equal, round, reactive to light and accommodation. No scleral icterus. Extraocular muscles intact.  HEENT: Head atraumatic, normocephalic. Oropharynx and nasopharynx clear.  NECK:  Supple, no jugular venous distention. No thyroid enlargement, no tenderness.  LUNGS: Normal breath sounds bilaterally, no wheezing, rales,rhonchi or crepitation. No use of accessory muscles of respiration.  CARDIOVASCULAR: S1, S2 normal. No murmurs, rubs, or gallops.  ABDOMEN: Soft, non-tender, non-distended. Bowel sounds present. No organomegaly or mass.  EXTREMITIES: No pedal edema, cyanosis, or clubbing.  NEUROLOGIC: Cranial nerves II through XII are intact. Muscle strength 5/5 in all extremities. Sensation  intact. Gait not checked.  PSYCHIATRIC: The patient is alert and oriented x 3.  SKIN; no ulcers.   DATA REVIEW:   CBC   Recent Labs Lab 10/18/16 0851  WBC 10.8  HGB 9.2*  HCT 28.4*  PLT 180    Chemistries   Recent Labs Lab 10/17/16 0324  NA 136  K 3.6  CL 106  CO2 25  GLUCOSE 135*  BUN 14  CREATININE 1.43*  CALCIUM 7.6*    Microbiology Results   Recent Results (from the past 240 hour(s))  Surgical pcr screen     Status: None   Collection Time: 10/14/16  3:49 PM  Result Value Ref Range Status   MRSA, PCR NEGATIVE NEGATIVE Final   Staphylococcus aureus NEGATIVE NEGATIVE Final    Comment:        The Xpert SA Assay (FDA approved for NASAL specimens in patients over 75 years of age), is one component of a comprehensive surveillance program.  Test performance has been validated by [facility] for patients greater than or equal to 71 year old. It is not intended to diagnose infection nor to guide or monitor treatment.     RADIOLOGY:  No results found.   Management plans discussed with  the patient, family and they are in agreement.  CODE STATUS:     Code Status Orders        Start     Ordered   10/14/16 1203  Full code  Continuous     10/14/16 1202    Code Status History    Date Active Date Inactive Code Status Order ID Comments User Context   This patient has a current code status but no historical code status.      TOTAL TIME TAKING CARE OF THIS PATIENT: *40* minutes.    Logen Fowle M.D on 10/18/2016 at 12:00 PM  Between 7am to 6pm - Pager - [phone] After 6pm go to [email or web address] - Social research officer, government  [facility]  Office  [phone]  CC: Primary care physician; Orland Mustard, MD

## 2016-10-18 NOTE — Progress Notes (Signed)
Occupational Therapy Treatment Patient Details Name: Sarah Lowery MRN: [id number] DOB: 05/06/1946 Today's Date: 10/18/2016    History of present illness Sarah Lowery  is a 71 y.o. female POD #2 L IM nailing for midshaft femur fx.    OT comments  Pt seen for OT treatment today. CSW in with pt at start of session. Pt agreeable to bed level treatment session today due to 10/10 pain. Pt educated in energy conservation strategies with handout provided to support falls prevention and maximize functional independence for ADL. Pt verbalized understanding of education provided. Pt progressing slowly, limited by pain. Continue to progress.   Follow Up Recommendations  SNF    Equipment Recommendations  3 in 1 bedside commode    Recommendations for Other Services      Precautions / Restrictions Precautions Precautions: Fall Required Braces or Orthoses: Knee Immobilizer - Left Restrictions Weight Bearing Restrictions: No LLE Weight Bearing: Partial weight bearing       Mobility Bed Mobility               General bed mobility comments: not tested, pt declined out of bed due to pain  Transfers                 General transfer comment: not tested, pt declined out of bed due to pain    Balance                                           ADL either performed or assessed with clinical judgement   ADL Overall ADL's : Needs assistance/impaired                                             Vision Baseline Vision/History: Wears glasses Wears Glasses: At all times Patient Visual Report: No change from baseline Vision Assessment?: No apparent visual deficits   Perception     Praxis      Cognition Arousal/Alertness: Awake/alert Behavior During Therapy: WFL for tasks assessed/performed Overall Cognitive Status: Within Functional Limits for tasks assessed                                          Exercises  Other Exercises Other Exercises: Pt educated in energy conservation strategies with handout provided to support falls prevention and maximize functional independence at home. Pt verbalized understanding   Shoulder Instructions       General Comments      Pertinent Vitals/ Pain       Pain Assessment: 0-10 Pain Score: 10-Worst pain ever Pain Location: LLE Pain Descriptors / Indicators: Aching Pain Intervention(s): Limited activity within patient's tolerance;Monitored during session  Home Living                                          Prior Functioning/Environment              Frequency  Min 1X/week        Progress Toward Goals  OT Goals(current goals can now be found in the care plan section)  Progress towards OT goals:  Progressing toward goals (progressing slowly, limited by pain)  Acute Rehab OT Goals Patient Stated Goal: get better OT Goal Formulation: With patient Time For Goal Achievement: 10/30/16 Potential to Achieve Goals: Good  Plan Discharge plan remains appropriate    Co-evaluation                 AM-PAC PT "6 Clicks" Daily Activity     Outcome Measure   Help from another person eating meals?: None Help from another person taking care of personal grooming?: None Help from another person toileting, which includes using toliet, bedpan, or urinal?: A Little Help from another person bathing (including washing, rinsing, drying)?: A Little Help from another person to put on and taking off regular upper body clothing?: A Little Help from another person to put on and taking off regular lower body clothing?: A Lot 6 Click Score: 19    End of Session    OT Visit Diagnosis: Other abnormalities of gait and mobility (R26.89);History of falling (Z91.81);Muscle weakness (generalized) (M62.81);Pain Pain - Right/Left: Left Pain - part of body: Leg   Activity Tolerance Patient limited by pain   Patient Left in bed;with call  bell/phone within reach;with bed alarm set   Nurse Communication          Time: 4098-1191 OT Time Calculation (min): 18 min  Charges: OT General Charges $OT Visit: 1 Procedure OT Treatments $Self Care/Home Management : 8-22 mins  Richrd Prime, MPH, MS, OTR/L ascom [phone] 10/18/16, 12:04 PM

## 2016-10-18 NOTE — Progress Notes (Signed)
Physical Therapy Treatment Patient Details Name: Sarah Lowery MRN: [id number] DOB: 05/13/1946 Today's Date: 10/18/2016    History of Present Illness Sarah Lowery  is a 71 y.o. female POD #2 L IM nailing for midshaft femur fx.     PT Comments    Pt requires encouragement to participate with PT; notes 10/10 pain Left lower extremity. Pt participates in limited bed exercises only with assist as needed. Notes touch me not pain on distal Right lower extremity as well as Left lower extremity. Pt plans to discharge to skilled nursing facility today to continue progress of strength, endurance and out of bed activity tolerance to improve function. Encouraged performance of exercises several times a day.    Follow Up Recommendations  SNF     Equipment Recommendations       Recommendations for Other Services       Precautions / Restrictions Precautions Precautions: Fall Restrictions Weight Bearing Restrictions: No LLE Weight Bearing: Partial weight bearing    Mobility  Bed Mobility               General bed mobility comments: Not tested; pt refused out of bed due to pain  Transfers                    Ambulation/Gait                 Stairs            Wheelchair Mobility    Modified Rankin (Stroke Patients Only)       Balance                                            Cognition Arousal/Alertness: Awake/alert Behavior During Therapy: WFL for tasks assessed/performed Overall Cognitive Status: Within Functional Limits for tasks assessed                                        Exercises General Exercises - Lower Extremity Ankle Circles/Pumps: AROM;Both;20 reps;Supine Quad Sets: Strengthening;Both;10 reps;Supine Gluteal Sets: Strengthening;Both;10 reps;Supine Short Arc Quad: AROM;Right;10 reps;Supine Heel Slides: AROM;Right;10 reps;Supine Hip ABduction/ADduction: AAROM;Left;10 reps;Supine (AROM  R) Straight Leg Raises: AAROM;10 reps;Both;Supine    General Comments        Pertinent Vitals/Pain Pain Assessment: 0-10 Pain Score: 10-Worst pain ever Pain Location: LLE Pain Intervention(s): Limited activity within patient's tolerance;Monitored during session;Premedicated before session;Ice applied    Home Living                      Prior Function            PT Goals (current goals can now be found in the care plan section) Progress towards PT goals: Not progressing toward goals - comment    Frequency    BID      PT Plan Current plan remains appropriate    Co-evaluation              AM-PAC PT "6 Clicks" Daily Activity  Outcome Measure  Difficulty turning over in bed (including adjusting bedclothes, sheets and blankets)?: Total Difficulty moving from lying on back to sitting on the side of the bed? : Total Difficulty sitting down on and standing up from a chair with arms (e.g., wheelchair, bedside commode, etc,.)?: Total  Help needed moving to and from a bed to chair (including a wheelchair)?: A Lot Help needed walking in hospital room?: Total Help needed climbing 3-5 steps with a railing? : Total 6 Click Score: 7    End of Session   Activity Tolerance: Patient limited by fatigue;Patient limited by pain Patient left: in bed;with call bell/phone within reach;with bed alarm set;Other (comment) (polar care in place)   PT Visit Diagnosis: Unsteadiness on feet (R26.81);History of falling (Z91.81);Muscle weakness (generalized) (M62.81)     Time: 1050-1105 PT Time Calculation (min) (ACUTE ONLY): 15 min  Charges:  $Therapeutic Exercise: 8-22 mins                    G Codes:        Scot Dock, PTA 10/18/2016, 11:53 AM

## 2016-10-18 NOTE — Clinical Social Work Placement (Signed)
   CLINICAL SOCIAL WORK PLACEMENT  NOTE  Date:  10/18/2016  Patient Details  Name: Sarah Lowery MRN: [id number] Date of Birth: 03/12/1946  Clinical Social Work is seeking post-discharge placement for this patient at the Skilled  Nursing Facility level of care (*CSW will initial, date and re-position this form in  chart as items are completed):  Yes   Patient/family provided with [place] Clinical Social Work Department's list of facilities offering this level of care within the geographic area requested by the patient (or if unable, by the patient's family).  Yes   Patient/family informed of their freedom to choose among providers that offer the needed level of care, that participate in Medicare, Medicaid or managed care program needed by the patient, have an available bed and are willing to accept the patient.  Yes   Patient/family informed of [facility]'s ownership interest in [facility] and [facility], as well as of the fact that they are under no obligation to receive care at these facilities.  PASRR submitted to EDS on       PASRR number received on       Existing PASRR number confirmed on 10/17/16     FL2 transmitted to all facilities in geographic area requested by pt/family on 10/17/16     FL2 transmitted to all facilities within larger geographic area on       Patient informed that his/her managed care company has contracts with or will negotiate with certain facilities, including the following:        Yes   Patient/family informed of bed offers received.  Patient chooses bed at  [facility] )     Physician recommends and patient chooses bed at      Patient to be transferred to  [facility]) on 10/18/16.  Patient to be transferred to facility by  [facility] )     Patient family notified on 10/18/16 of transfer.  Name of family member notified:   (Patient reported that she would call her daughter Thompson GrayerKorine and make her aware of D/C  today. )     PHYSICIAN       Additional Comment:    _______________________________________________ Markeis Allman, Darleen CrockerBailey M, LCSW 10/18/2016, 1:38 PM

## 2016-10-18 NOTE — Progress Notes (Signed)
   Subjective: 3 Days Post-Op Procedure(s) (LRB): INTRAMEDULLARY (IM) RETROGRADE FEMORAL NAILING (Left) Patient reports pain as mild.  Patient sleeping comfortably. Patient is well, and has had no acute complaints or problems.  Denies any CP, SOB, ABD pain. We will resume physical therapy today.   Objective: Vital signs in last 24 hours: Temp:  [98.2 F (36.8 C)-99.6 F (37.6 C)] 98.2 F (36.8 C) (05/14 0736) Pulse Rate:  [76-81] 76 (05/14 0736) Resp:  [16-18] 18 (05/14 0736) BP: (111-172)/(45-54) 172/54 (05/14 0736) SpO2:  [95 %-97 %] 96 % (05/14 0736)  Intake/Output from previous day: 05/13 0701 - 05/14 0700 In: 1345.8 [P.O.:480; I.V.:865.8] Out: 450 [Urine:450] Intake/Output this shift: No intake/output data recorded.   Recent Labs  10/16/16 0344 10/17/16 0324  HGB 9.9* 7.7*    Recent Labs  10/16/16 0344 10/17/16 0324  WBC 7.3 8.3  RBC 3.31* 2.62*  HCT 29.6* 23.4*  PLT 161 142*    Recent Labs  10/16/16 0344 10/17/16 0324  NA 134* 136  K 3.9 3.6  CL 103 106  CO2 25 25  BUN 12 14  CREATININE 1.33* 1.43*  GLUCOSE 200* 135*  CALCIUM 8.0* 7.6*   No results for input(s): LABPT, INR in the last 72 hours.  EXAM General - Patient is Alert, Appropriate and Oriented Extremity - Neurovascular intact Sensation intact distally Intact pulses distally Dorsiflexion/Plantar flexion intact No cellulitis present Compartment soft  Knee immobilizer and polar care unit intact. Dressing - dressing C/D/I and scant drainage, No hematoma present. Thigh is soft. Motor Function - intact, moving foot and toes well on exam.  Past Medical History:  Diagnosis Date  . Anemia   . Arthritis   . Diastolic CHF (HCC)   . Difficult intubation   . GERD (gastroesophageal reflux disease)   . Gout   . H/O ETOH abuse   . Hyperlipemia   . Hypertension   . Hypothyroidism   . Liver cirrhosis (HCC)   . OA (osteoarthritis)     Assessment/Plan:   3 Days Post-Op Procedure(s)  (LRB): INTRAMEDULLARY (IM) RETROGRADE FEMORAL NAILING (Left) Active Problems:   Hip fracture (HCC)   Acute post op blood loss anemia with underlying anemia of chronic disease   Chronic Kidney disease  Estimated body mass index is 22.44 kg/m as calculated from the following:   Height as of this encounter: 5\' 6"  (1.676 m).   Weight as of this encounter: 63 kg (139 lb). Advance diet Up with therapy, partial weight-bearing left lower extremity Acute postop blood loss anemia with hx of underlying anemia of chronic disease, Hemoglobin 7.7, continue with iron supplementation and recheck hemoglobin today. If Hgb stable ok to discharge to SNF today. Follow-up with [facility] in 2 weeks for staple removal and Steri-Strip application. Lovenox 40 mg subcutaneous daily 14 days     DVT Prophylaxis - Lovenox and Foot Pumps Partial Weight-Bearing left lower extremity   T. Cranston Neighborhris Elisah Parmer, PA-C [facility] 10/18/2016, 8:14 AM

## 2016-10-18 NOTE — Progress Notes (Signed)
Clinical Social Worker (CSW) met with patient and presented bed offers and patient chose [facility]. Per [facility] liaison patient owes [facility] money from her previous stay. [facility] called patient directly and made her aware of above. Patient requested for [facility] to call her daughter Lennette Bihari. [facility] contacted patient's daughter Lennette Bihari and made her aware of above. Per [facility] if patient can do a promissory note then they will consider accepting her today. CSW attempted to contact patient's daughter Lennette Bihari however she did not answer. CSW made patient aware of [facility]'s request. Patient called her daughter while CSW was in the room. CSW spoke to daughter and made her aware of above. Per daughter she called [facility] stated that patient does not owe money. Per daughter [facility] will be calling [facility].   CSW contacted [facility] and spoke to [place] after [facility] talked with [facility]. Per [facility] and [facility]'s records match and patient still owes money. CSW made patient and daughter aware of above. Daughter sounded angry over the telephone and insisted that patient does not owe money. CSW provided emotional support and stated that [facility] will not accept patient unless she agrees to pay. CSW explained to daughter that patient can go to another facility where she does not have to pay up front. Daughter stated that patient will do the promissory note. [facility] called patient directly for a second time. Per [facility] patient stated that she can't afford to go to [facility] and will go to another facility.   CSW met with patient who confirmed that she will go to another facility. CSW provided other bed offers and patient chose [facility]. Per Neoma Laming admissions coordinator at [facility] patient can come today to a semi-private room and move to a private room on the rehab hall tomorrow afternoon. Patient is agreeable to going to a semi-private room today. Patient will go to room 223-A. RN will call report to [facility] and arrange EMS for transport. CSW sent D/C orders to  [facility] via [facility]. Patient requested for CSW to not call her daughter and stated that she will call her daughter later and let her know she is going to [facility]. Please reconsult if future social work needs arise. CSW signing off.   [employer], LCSW [phone]

## 2018-07-11 IMAGING — DX DG CHEST 1V PORT
1 series · 1 of 1 positions shown · non-contrast
Comparison: None in PACs

CLINICAL DATA: Status post fall at home today. History of CHF,
hypertension, cirrhosis, former smoker.

EXAM:
PORTABLE CHEST 1 VIEW

[chest ap]
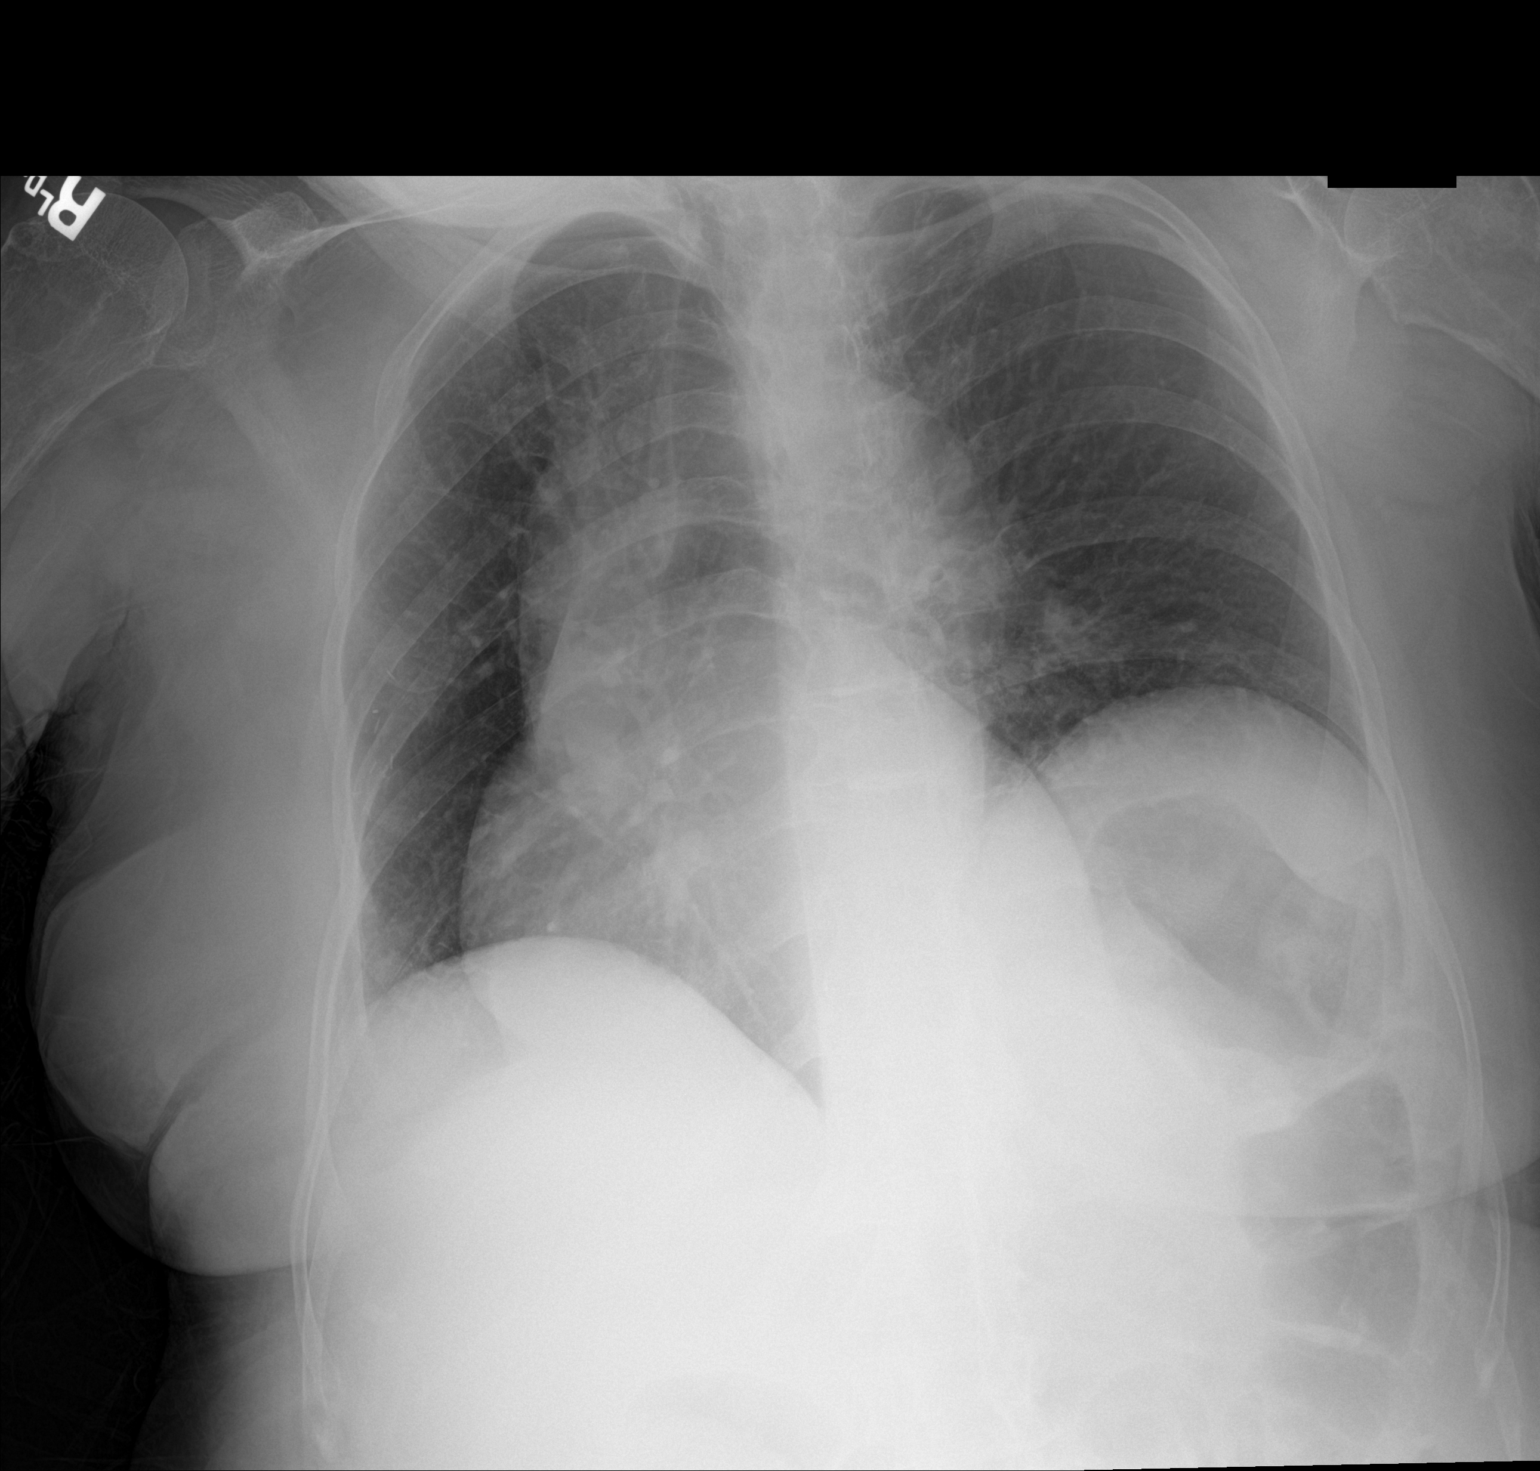

[1 of 1 positions shown; findings below may reference images not displayed]

FINDINGS: The patient is rotated to the right on this study. There is mild
elevation of the left hemidiaphragm Jenson which limits excursion of
the left lung. The aerated portions of both lungs are clear. The
heart is top-normal in size. The pulmonary vascularity is not
engorged. The bony thorax exhibits no acute abnormality.
IMPRESSION: No pneumonia nor pulmonary edema. Mild elevation of the left
hemidiaphragm of uncertain age.

## 2018-07-11 IMAGING — DX DG FEMUR 2+V PORT*L*
4 series · 4 of 4 positions shown · non-contrast
Comparison: None.

CLINICAL DATA: Left femur pain after fall today at home.

EXAM:
LEFT FEMUR PORTABLE 2 VIEWS

[femur ap (1 of 2)]
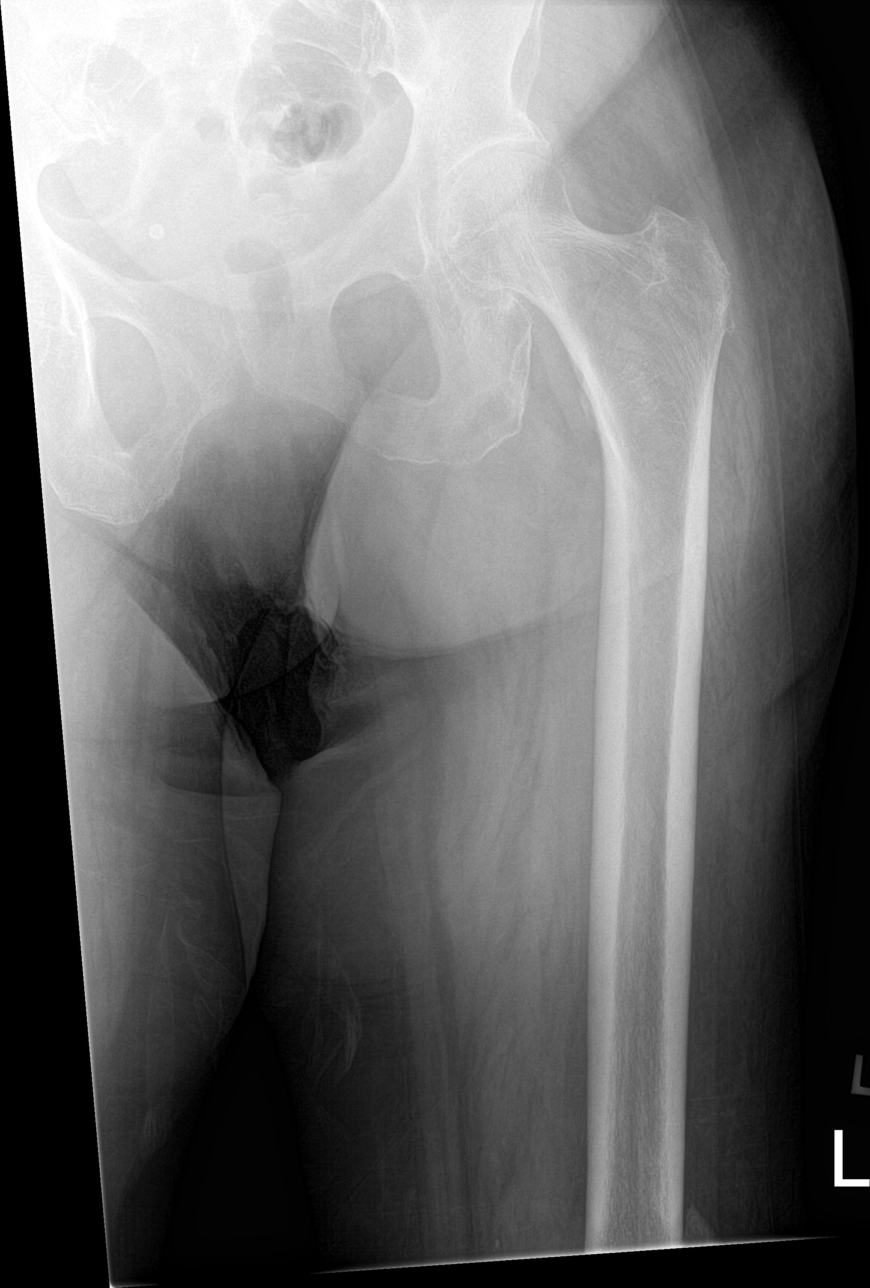

[femur ap (2 of 2)]
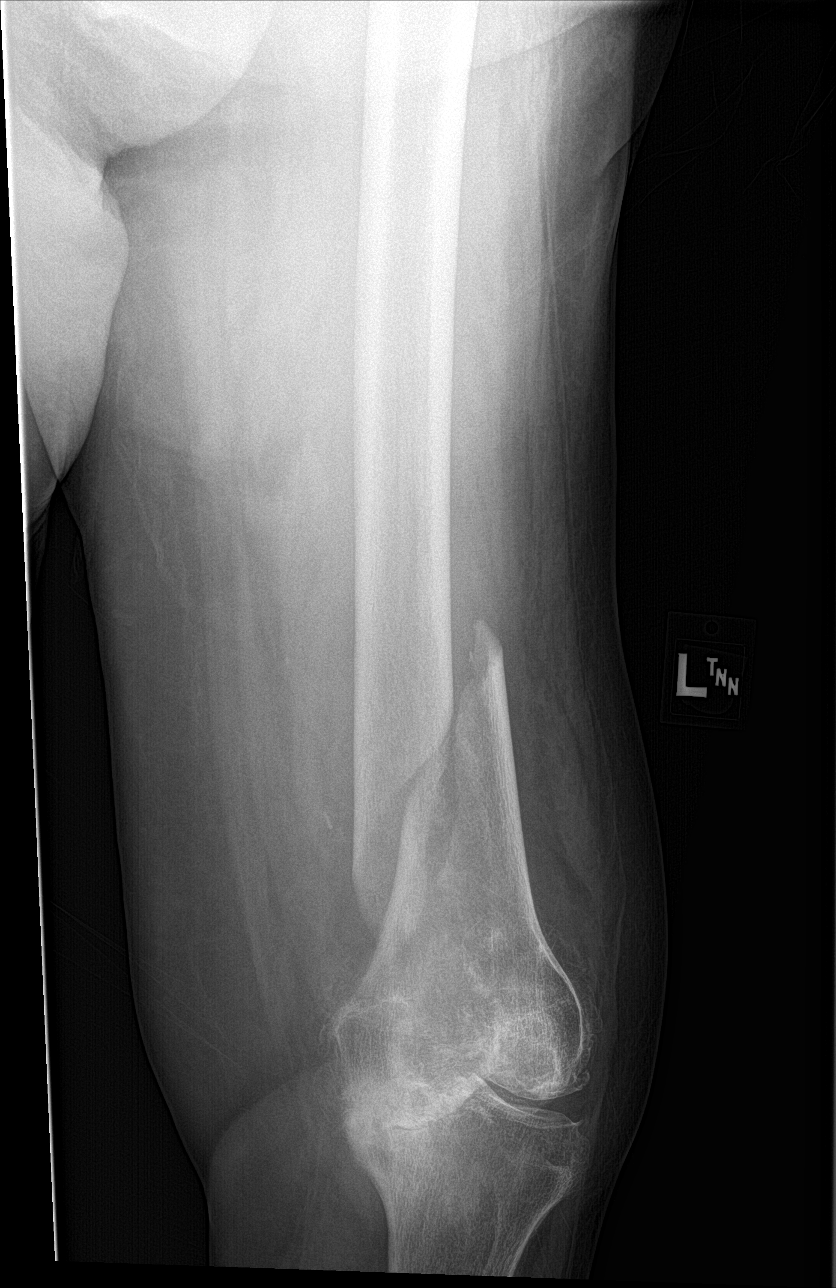

[femur lat (1 of 2)]
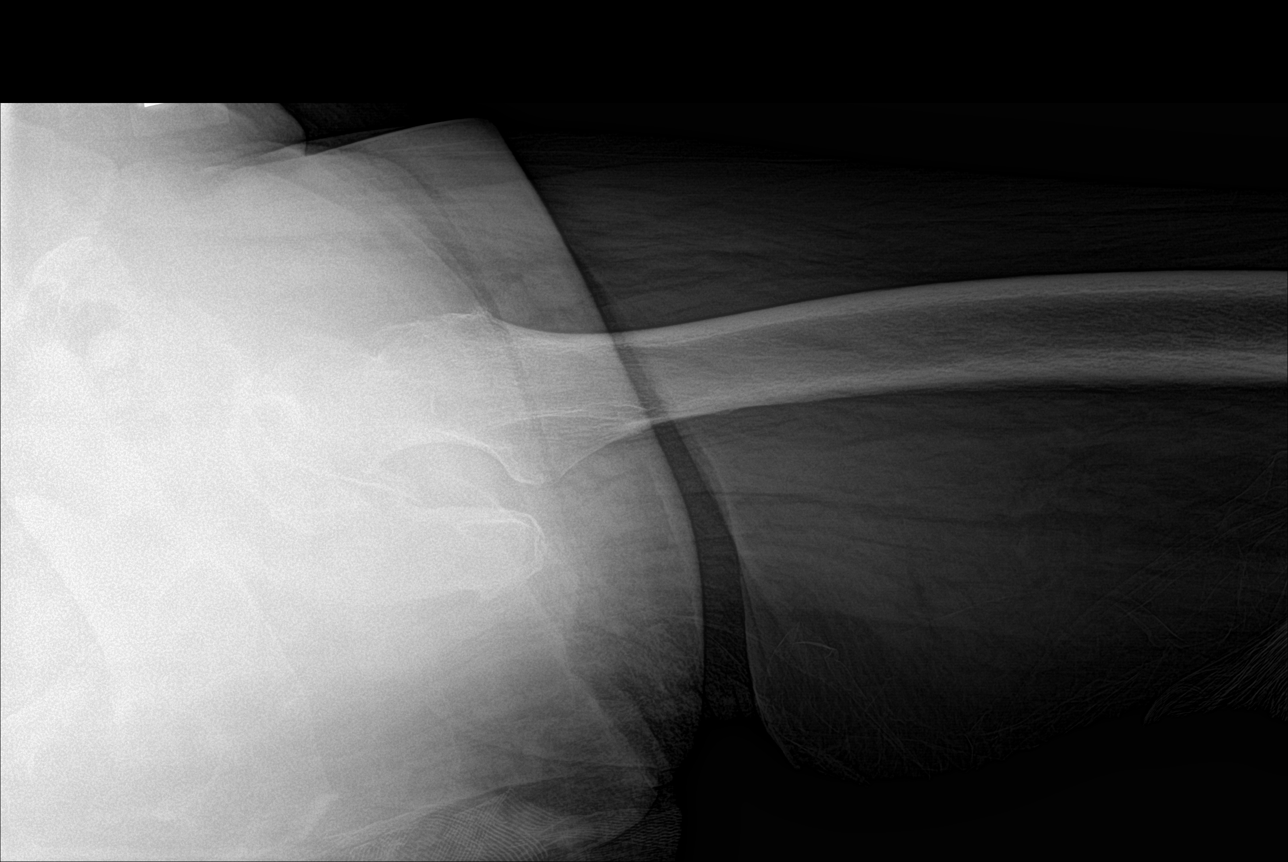

[femur lat (2 of 2)]
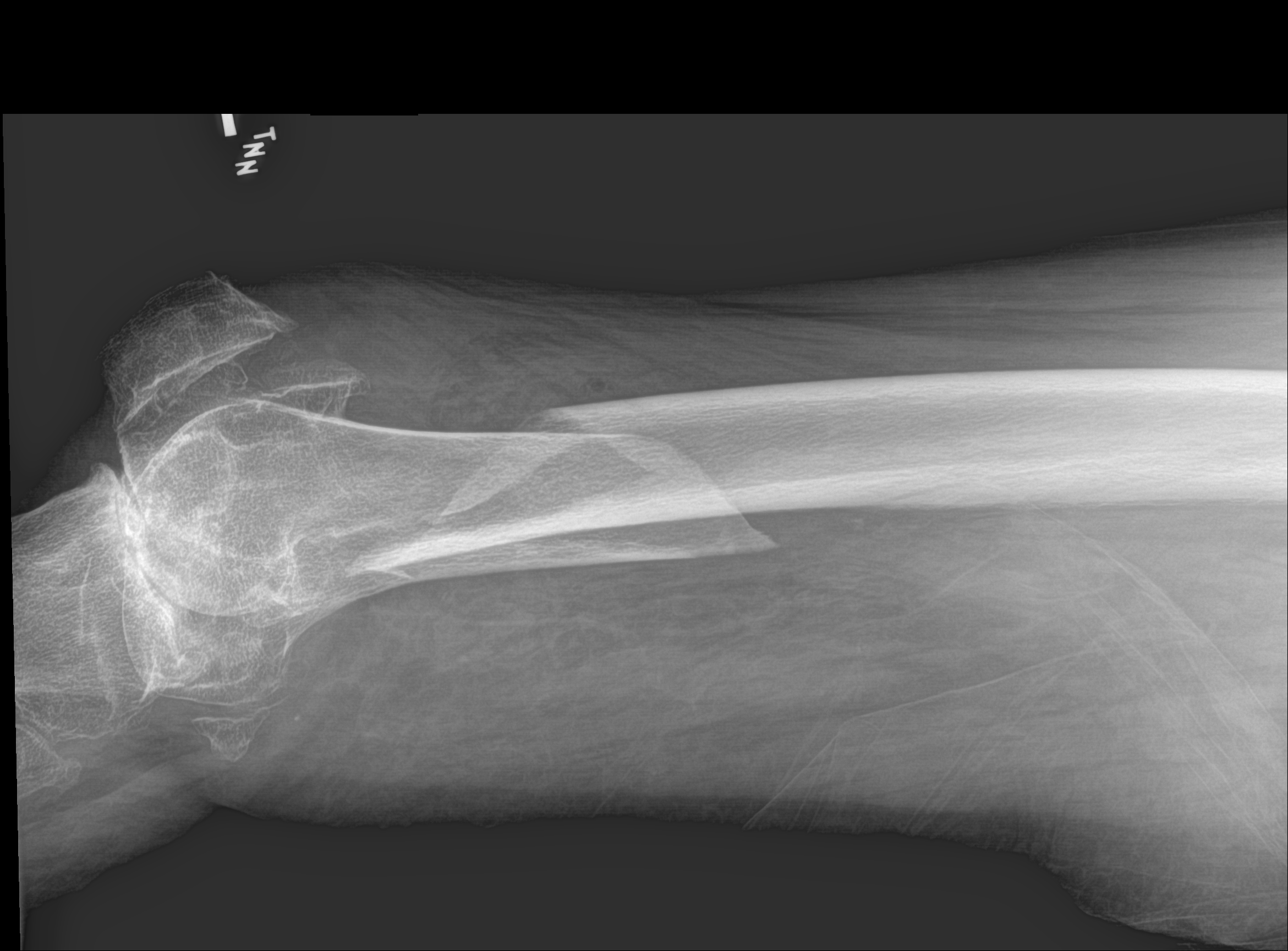

[4 of 4 positions shown; findings below may reference images not displayed]

FINDINGS: Severely displaced spiral fracture is seen involving the distal left
femoral shaft. Severe degenerative changes seen involving the left
knee joint medially. Left hip is unremarkable.
IMPRESSION: Severely displaced spiral fracture involving the distal left femoral
shaft.
